# Patient Record
Sex: Female | Born: 1975 | Race: White | Hispanic: No | Marital: Married | Smoking: Never smoker
Health system: Southern US, Community
[De-identification: ages and names within clinical notes are randomized; demographics above are authoritative.]

## PROBLEM LIST (undated history)

## (undated) DIAGNOSIS — J45909 Unspecified asthma, uncomplicated: Secondary | ICD-10-CM

## (undated) DIAGNOSIS — R011 Cardiac murmur, unspecified: Secondary | ICD-10-CM

---
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
## 2000-03-01 ENCOUNTER — Ambulatory Visit (HOSPITAL_COMMUNITY): Admission: RE | Admit: 2000-03-01 | Discharge: 2000-03-01 | Payer: Self-pay | Admitting: Internal Medicine

## 2000-04-19 ENCOUNTER — Encounter: Payer: Self-pay | Admitting: Family Medicine

## 2000-04-19 ENCOUNTER — Encounter: Admission: RE | Admit: 2000-04-19 | Discharge: 2000-04-19 | Payer: Self-pay | Admitting: Family Medicine

## 2006-03-27 ENCOUNTER — Inpatient Hospital Stay (HOSPITAL_COMMUNITY): Admission: AD | Admit: 2006-03-27 | Discharge: 2006-03-31 | Payer: Self-pay | Admitting: Obstetrics and Gynecology

## 2008-06-02 ENCOUNTER — Encounter: Admission: RE | Admit: 2008-06-02 | Discharge: 2008-06-02 | Payer: Self-pay | Admitting: Sports Medicine

## 2008-06-12 ENCOUNTER — Ambulatory Visit (HOSPITAL_BASED_OUTPATIENT_CLINIC_OR_DEPARTMENT_OTHER): Admission: RE | Admit: 2008-06-12 | Discharge: 2008-06-12 | Payer: Self-pay | Admitting: Orthopedic Surgery

## 2008-12-18 HISTORY — PX: HAND TENDON SURGERY: SHX663

## 2010-01-17 ENCOUNTER — Encounter: Admission: RE | Admit: 2010-01-17 | Discharge: 2010-01-17 | Payer: Self-pay | Admitting: Obstetrics and Gynecology

## 2011-05-02 NOTE — Op Note (Signed)
NAME:  Sheila Cruz, Sheila Cruz NO.:  [id number]   MEDICAL RECORD NO.:  [id number]          PATIENT TYPE:  AMB   LOCATION:  DSC                          FACILITY:  [facility]   PHYSICIAN:  Cindee Salt, M.D.       DATE OF BIRTH:  May 07, 1976   DATE OF PROCEDURE:  DATE OF DISCHARGE:                               OPERATIVE REPORT   PREOPERATIVE DIAGNOSIS:  Ruptured ulnar collateral ligament,  metacarpophalangeal joint, left thumb.   POSTOPERATIVE DIAGNOSIS:  Ruptured ulnar collateral ligament,  metacarpophalangeal joint, left thumb.   OPERATION:  Repair of ulnar collateral ligament, metacarpophalangeal  joint, left thumb   SURGEON:  Cindee Salt, M.D.   ASSISTANT:  None.   ANESTHESIA:  Axillary block.   DATE OF OPERATION:  June 12, 2008.   ANESTHESIOLOGIST:  Germaine Pomfret, M.D.   HISTORY:  The patient is a 35 year old female with a history of an  injury to the metacarpophalangeal joint, left thumb.  This has been  painful for her.  This has not responded well to therapy.  She is  admitted for reconstruction.  MRI reveals a rupture of the ulnar  collateral ligament.  She is aware of risks and complications including  infection, recurrence injury to arteries, nerves, tendons, incomplete  relief of symptoms, dystrophy.  In the preoperative area, the patient is  seen.  The extremity marked by both the patient and surgeon, questions  encouraged and answered.   PROCEDURE:  The patient is brought to the operating room where an  axillary block was carried out without difficulty under the direction of  Dr. Jean Rosenthal.  She was prepped using DuraPrep, supine position, left arm  free.  A time-out was taken.  The limb was exsanguinated with an Esmarch  bandage, tourniquet placed high and the arm was inflated to 250 mmHg.  A  curvilinear incision was made over the metacarpophalangeal joint, apex  ulnarly, carried down through subcutaneous tissue.  Bleeders were  electrocauterized.   The dorsal and ulnar and radial sensory branches  were identified.  An incision was then made into the abductor  aponeurosis.  The collateral ligament was found be scarred, the joint  opened.  The cartilage was found to be intact.  The ligament was then  dissected free from the proximal phalanx where it had been avulsed, the  scar removed.  The area was then roughened with a rongeur.  Drill holes  were placed with a 0.035 K-wire bringing them out onto the radial side  of the proximal phalanx.  Two, 21-gauge needles were then inserted to  each one of the drill holes.  A 32-gauge monofilament wire was bent  over, these were passed through each of the needles and brought out to  the ulnar side.  The needles were removed.  A 2-0 Tycron suture was then  used as the modified Kessler suture into the out ligament.  These were  then inserted onto each one of the looped wires, then pulled through to  the radial side.  These were then tied over the bone on the radial  aspect, taking care to protect the radial sensory nerves.  This firmly  fixed the collateral ligament back into a roughened bed on the proximal  phalanx.  This was placed through a full range of motion.  No  instability was noted to stressing.  The joint was then irrigated.  The  remainder was closed with figure-of-eight, 4-0 Mersilene sutures.  The  abductor aponeurosis was repaired with a running Mersilene suture.  The  skin was repaired with a subcuticular 5-0 Vicryl Rapide suture.  A  sterile compressive dressing and splint thumb spica in nature was  applied.  On deflation  of the tourniquet, all fingers immediately pinked along with her thumb.  She was taken to the recovery room for observation in satisfactory  condition.  She will be discharged home to return to the [facility]  [place] in 1 week, on Vicodin.           ______________________________  Cindee Salt, M.D.     GK/MEDQ  D:  06/12/2008  T:  06/13/2008  Job:   [id number]

## 2011-05-05 NOTE — Discharge Summary (Signed)
NAME:  Sheila Cruz, Sheila Cruz NO.:  [id number]   MEDICAL RECORD NO.:  [id number]          PATIENT TYPE:  INP   LOCATION:  9118                          FACILITY:  [facility]   PHYSICIAN:  Lenoard Aden, M.D.DATE OF BIRTH:  [birth year]   DATE OF ADMISSION:  03/27/2006  DATE OF DISCHARGE:  03/31/2006                                 DISCHARGE SUMMARY   ADMISSION DIAGNOSES:  1. 39 weeks' gestation.  2. Pregnancy induced hypertension preeclampsia.  3. Favorable Bishop score.   DISCHARGE DIAGNOSES:  1. Postoperative day #3, status post Cesarean section for viable female      newborn in stable condition.  2. Resolving pregnancy induced hypertension.   HISTORY OF PRESENT ILLNESS:  The patient is a 35 year old gravida 1, para 0-  0-0-0 presenting at 30 weeks and 2/7.  Date of last menstrual period is June 24, 2005 with an EDD of March 31, 2006.  The patient is sent from [facility] after physician evaluation by Dr. Annabell Howells for elevated blood pressure and  increased deep tendon reflexes.  The patient's vital signs on admission:  Temperature 98, pulse 105, respirations 16, blood pressure 137/80.  Cervical  exam is 1-2 cm dilation, 70% effaced, vertex presentation with a bulging bag  of water.  The patient reports intermittent contractions and cramps with no  leakage of fluid, no vaginal bleeding, and active fetal activity.  The  patient reports intermittent headache with changes in vision earlier in the  day, mild nausea, and decreased appetite.   CURRENT MEDICATIONS:  1. The patient is on Zyrtec 10 mg daily.  2. Prenatal vitamins one tablet daily.  3. Tums over the counter as needed for indigestion and heartburn.   ALLERGIES:  1. The patient has known allergies to SULFA medications.  2. BIAXIN.   HISTORY OF PREGNANCY:  The patient has received prenatal care at [facility] since approximately nine weeks' gestation with primary care Arwa Yero of  Dr. Annabell Howells.   GYNECOLOGIC HISTORY:  The patient's gynecological history includes normal  Pap smears, no history of sexually transmitted diseases.   PAST FAMILY PSYCHIATRIC HISTORY:  1. Known IBS.  2. Heart murmur.  3. Fetal echo during current pregnancy.  No antibiotic prophylaxis is      required.   PAST SURGICAL HISTORY:  1. Wisdom teeth extraction.  2. Ganglion cyst removal from her hand.   LABORATORY DATA:  The patient's blood type is O positive, antibody screen is  negative.  Baseline hemoglobin is 13.8 with a baseline platelet count of  275.  Rubella status is immune.  Hepatitis B surface antigen is negative.  Syphilis screening is nonreactive.  HIV is nonreactive.  Gonorrhea and  Chlamydia cultures are negative.  Beta Strep is negative.  The patient's  baseline blood pressure in the first trimester is 100/62.   HOSPITAL COURSE:  The patient is admitted with plan of induction of labor  with Pitocin and artificial rupture of membranes.  Hospital day #1:  The  patient is admitted to labor and delivery for induction of labor for  PIH  preeclampsia.  The patient is in latent early labor on April 11 at midnight  with artificial rupture of membranes with a clear fluid.  The patient is 2  cm dilated, 80%, vertex presentation at a -2 station.  The patient is on low  dose Pitocin protocol.  By 8 a.m. on the same day the patient had progressed  to 6 cm dilation in active labor with an epidural anesthesia in place for  pain management.  The patient continued to progress and began active second  stage afternoon of March 28, 2006.  Attempt for vacuum assisted delivery  unsuccessful with observation for fetal descent, at which time the decision  was made to proceed for primary cesarean section for failure to descend.  See operative note of Dr. Billy Coast.  The patient delivered a viable female on  March 28, 2006 at 1407.  Apgar scores were 9 and 9.  Newborn weight is 8  pounds 10 ounces.  The patient proceed to  postoperative recovery and  mother/baby postoperative care.  Postoperative day #1, vital signs were  stable.  The patient is afebrile.  Hemoglobin postoperative is 10.6, down  from a baseline of 12.8.  Hematocrit is 31, down from a baseline of 37.4.  Platelet count is stable at 227.  PIH labs are within normal limits.  SGOT  is 22, SGPT is 15.  LDH is 143, down from the baseline of 310 on admission.  Uric acid is stable at 5.1.  Postoperative day #2 the patient remained  stable with vital signs stable, afebrile.  Incision is well-approximated  with staples.  The patient is discharged on postoperative day #3 in stable  condition.  The patient is discharged home with her newborn.  The patient is  bottle feeding.   DISCHARGE INSTRUCTIONS:   DIET:  The patient is on regular diet at home.   ACTIVITY:  Activity ad lib with no heavy lifting.  The patient is to follow  instruction booklet from [facility] for specific instructions on  postpartum and postoperative care.   DISCHARGE MEDICATIONS:  1. Prenatal vitamin once daily.  2. Allegra daily for allergies.  3. Ibuprofen 800 mg every 8 hours as needed for discomfort.  4. Percocet 1-2 tablets every 4-6 hours as needed for pain.   FOLLOWUP:  The patient is to return to [facility] in two weeks for a  wound and incision check and again at six weeks for her routine postpartum  visit.     Marlinda Mike, C.N.M.      Lenoard Aden, M.D.  Electronically Signed   TB/MEDQ  D:  05/30/2006  T:  05/30/2006  Job:  [id number]

## 2011-05-05 NOTE — Op Note (Signed)
NAME:  Sheila Cruz, Sheila Cruz NO.:  [id number]   MEDICAL RECORD NO.:  [id number]          PATIENT TYPE:  INP   LOCATION:  9118                          FACILITY:  [facility]   PHYSICIAN:  Lenoard Aden, M.D.DATE OF BIRTH:  May 31, 1976   DATE OF PROCEDURE:  03/28/2006  DATE OF DISCHARGE:                                 OPERATIVE REPORT   PREOPERATIVE DIAGNOSES:  Thirty-nine-week intrauterine pregnancy, pregnancy-  induced hypertension, failure to descend.   POSTOPERATIVE DIAGNOSES:  Thirty-nine-week intrauterine pregnancy, pregnancy-  induced hypertension, failure to descend.   PROCEDURE:  Primary low segment transverse cesarean section.   SURGEON:  Lenoard Aden, M.D.   ASSISTANT:  Maxie Better, M.D.   ANESTHESIA:  Epidural by Malen Gauze.   ESTIMATED BLOOD LOSS:  7 cc.   COMPLICATIONS:  None.   DRAINS:  Foley.   COUNTS:  Correct.   DISPOSITION:  Patient to recovery in good condition.   BRIEF OPERATIVE NOTE:  Being apprised of the risks of anesthesia, infection,  bleeding, intraoperative injury, need for repair, delayed versus immediate  complications to include bowel and bladder injury, the patient was brought  to the operating room where she was administered epidural anesthetic without  complications, prepped and draped in the usual sterile fashion.  Foley  catheter previously placed.  Marcaine solution placed and a Pfannenstiel  skin incision made with the scalpel down to fascia which was nicked in the  midline and tented up using Mayo scissors.  Rectus muscles dissected sharply  in the midline.  Peritoneum entered sharply and bladder blade placed.  Distal peritoneum scored sharply in the left lower uterine segment,  histotomy incision made.  Atraumatic delivery of a full-term living female,  occiput transverse position.  Handed to pediatricians in attendance.  Apgars  8 and 9.  Cord blood collected.  Placenta delivered manually intact, 3-  vessel cord.   Uterus exteriorized.  Small fundal fibroid, less than 2 cm,  noted.  Normal tubes, normal ovaries.  Uterus was curetted using a lap pack  and closed in two layers using a 0 Monocryl suture.  Interrupted sutures  were placed for hemostasis at the right lateral portion of the incision.  Bladder flap inspected and found to be hemostatic.  Irrigation accomplished.  Fascia closed using 0 Monocryl tissue in running fashion.  Skin closed using  staples.  The patient tolerated the procedure well and was transferred to  recovery in good condition.      Lenoard Aden, M.D.  Electronically Signed     RJT/MEDQ  D:  03/28/2006  T:  03/29/2006  Job:  [id number]

## 2011-09-14 LAB — HCG, SERUM, QUALITATIVE: Preg, Serum: NEGATIVE

## 2011-09-14 LAB — POCT HEMOGLOBIN-HEMACUE: Hemoglobin: 13.5

## 2011-12-27 ENCOUNTER — Other Ambulatory Visit: Payer: Self-pay | Admitting: Family Medicine

## 2011-12-27 DIAGNOSIS — J329 Chronic sinusitis, unspecified: Secondary | ICD-10-CM

## 2011-12-29 ENCOUNTER — Ambulatory Visit
Admission: RE | Admit: 2011-12-29 | Discharge: 2011-12-29 | Disposition: A | Discharge status: Home / Self Care | Payer: BC Managed Care – PPO | Source: Ambulatory Visit | Attending: Family Medicine | Admitting: Family Medicine

## 2011-12-29 DIAGNOSIS — J329 Chronic sinusitis, unspecified: Secondary | ICD-10-CM

## 2012-01-10 ENCOUNTER — Ambulatory Visit
Admission: RE | Admit: 2012-01-10 | Discharge: 2012-01-10 | Disposition: A | Discharge status: Home / Self Care | Payer: BC Managed Care – PPO | Source: Ambulatory Visit | Attending: Allergy | Admitting: Allergy

## 2012-01-10 ENCOUNTER — Other Ambulatory Visit: Payer: Self-pay | Admitting: Allergy

## 2012-01-10 DIAGNOSIS — R05 Cough: Secondary | ICD-10-CM

## 2013-02-28 ENCOUNTER — Other Ambulatory Visit: Payer: Self-pay | Admitting: Gastroenterology

## 2013-02-28 ENCOUNTER — Ambulatory Visit
Admission: RE | Admit: 2013-02-28 | Discharge: 2013-02-28 | Disposition: A | Discharge status: Home / Self Care | Payer: BC Managed Care – PPO | Source: Ambulatory Visit | Attending: Gastroenterology | Admitting: Gastroenterology

## 2013-02-28 DIAGNOSIS — R1032 Left lower quadrant pain: Secondary | ICD-10-CM

## 2013-02-28 MED ORDER — IOHEXOL 300 MG/ML  SOLN
30.0000 mL | Freq: Once | INTRAMUSCULAR | Status: AC | PRN
  Administered 2013-02-28: 30 mL via ORAL

## 2013-02-28 MED ORDER — IOHEXOL 300 MG/ML  SOLN
100.0000 mL | Freq: Once | INTRAMUSCULAR | Status: AC | PRN
  Administered 2013-02-28: 100 mL via INTRAVENOUS

## 2013-03-26 ENCOUNTER — Other Ambulatory Visit: Payer: Self-pay | Admitting: Gastroenterology

## 2013-03-26 DIAGNOSIS — K5790 Diverticulosis of intestine, part unspecified, without perforation or abscess without bleeding: Secondary | ICD-10-CM

## 2013-04-25 ENCOUNTER — Ambulatory Visit
Admission: RE | Admit: 2013-04-25 | Discharge: 2013-04-25 | Disposition: A | Discharge status: Home / Self Care | Payer: BC Managed Care – PPO | Source: Ambulatory Visit | Attending: Gastroenterology | Admitting: Gastroenterology

## 2013-04-25 DIAGNOSIS — K5790 Diverticulosis of intestine, part unspecified, without perforation or abscess without bleeding: Secondary | ICD-10-CM

## 2018-05-13 ENCOUNTER — Encounter (HOSPITAL_COMMUNITY): Payer: Self-pay | Admitting: Emergency Medicine

## 2018-05-13 ENCOUNTER — Other Ambulatory Visit: Payer: Self-pay

## 2018-05-13 ENCOUNTER — Ambulatory Visit (HOSPITAL_COMMUNITY)
Admission: EM | Admit: 2018-05-13 | Discharge: 2018-05-13 | Disposition: A | Discharge status: Home / Self Care | Payer: BLUE CROSS/BLUE SHIELD | Attending: Family Medicine | Admitting: Family Medicine

## 2018-05-13 ENCOUNTER — Ambulatory Visit (INDEPENDENT_AMBULATORY_CARE_PROVIDER_SITE_OTHER): Payer: BLUE CROSS/BLUE SHIELD

## 2018-05-13 DIAGNOSIS — R0781 Pleurodynia: Secondary | ICD-10-CM

## 2018-05-13 DIAGNOSIS — W19XXXA Unspecified fall, initial encounter: Secondary | ICD-10-CM

## 2018-05-13 HISTORY — DX: Cardiac murmur, unspecified: R01.1

## 2018-05-13 HISTORY — DX: Unspecified asthma, uncomplicated: J45.909

## 2018-05-13 MED ORDER — KETOROLAC TROMETHAMINE 60 MG/2ML IM SOLN
INTRAMUSCULAR | Status: AC
  Filled 2018-05-13: qty 2

## 2018-05-13 MED ORDER — KETOROLAC TROMETHAMINE 60 MG/2ML IM SOLN
60.0000 mg | Freq: Once | INTRAMUSCULAR | Status: AC
  Administered 2018-05-13: 60 mg via INTRAMUSCULAR

## 2018-05-13 MED ORDER — HYDROCODONE-ACETAMINOPHEN 5-325 MG PO TABS: 1.0000 | ORAL_TABLET | ORAL | 0 refills | Status: DC | PRN

## 2018-05-13 MED ORDER — IBUPROFEN 800 MG PO TABS: 800.0000 mg | ORAL_TABLET | Freq: Three times a day (TID) | ORAL | 0 refills | Status: AC | PRN

## 2018-05-13 NOTE — ED Triage Notes (Signed)
The patient presented to the [facility] with a complaint of back and right side rib pain secondary to a fall that occurred earlier this am.

## 2018-05-13 NOTE — ED Provider Notes (Signed)
[facility]    CSN: [id number] Arrival date & time: 05/13/18  1654     History   Chief Complaint Chief Complaint  Patient presents with  . Fall    HPI Sheila Cruz is a 42 y.o. female.   HPI  Patient was on a camping trip for this weekend.  She fell on her camper at about 1 in the morning and went backwards hitting her right ribs on a hard object.  Unknown.  She states "I was half asleep".  She landed sitting.  Ever since then she has had severe pain in her right lower ribs.  Hurts to breathe, cough, sneeze, laugh.  No prior injury.  No underlying lung disease such as asthma, COPD.  She has not taken anything for pain, or used ice or heat.  Did not know what to use. Previously healthy, on no medications except for allergy medicine. No cough, sputum, fever or history of pneumonia.  Past Medical History:  Diagnosis Date  . Asthma   . Heart murmur     There are no active problems to display for this patient.   Past Surgical History:  Procedure Laterality Date  . CESAREAN SECTION  2007  . FINGER SURGERY Right 1995  . HAND TENDON SURGERY Left 2010    OB History   None      Home Medications    Prior to Admission medications   Medication Sig Start Date End Date Taking? Authorizing Provider  fluticasone (FLONASE) 50 MCG/ACT nasal spray Place 1 spray into both nostrils daily.   Yes [provider]  levocetirizine (XYZAL) 5 MG tablet Take 5 mg by mouth every evening.   Yes [provider]  montelukast (SINGULAIR) 10 MG tablet Take 10 mg by mouth at bedtime.   Yes [provider]  HYDROcodone-acetaminophen (NORCO/VICODIN) 5-325 MG tablet Take 1-2 tablets by mouth every 4 (four) hours as needed. 05/13/18   Eustace Moore, MD  ibuprofen (ADVIL,MOTRIN) 800 MG tablet Take 1 tablet (800 mg total) by mouth every 8 (eight) hours as needed for moderate pain. 05/13/18   Eustace Moore, MD    Family History History reviewed. No  pertinent family history.  Social History Social History   Tobacco Use  . Smoking status: Never Smoker  . Smokeless tobacco: Never Used  Substance Use Topics  . Alcohol use: Yes    Alcohol/week: 0.6 oz    Types: 1 Glasses of wine per week  . Drug use: Never     Allergies   Biaxin [clarithromycin] and Sulfa antibiotics   Review of Systems Review of Systems  Constitutional: Negative for chills and fever.  HENT: Negative for ear pain and sore throat.   Eyes: Negative for pain and visual disturbance.  Respiratory: Negative for cough and shortness of breath.   Cardiovascular: Positive for chest pain. Negative for palpitations.       Chest wall pain  Gastrointestinal: Negative for abdominal pain and vomiting.  Genitourinary: Negative for dysuria and hematuria.  Musculoskeletal: Negative for arthralgias and back pain.  Skin: Negative for color change and rash.  Neurological: Negative for seizures and syncope.  All other systems reviewed and are negative.    Physical Exam Triage Vital Signs ED Triage Vitals  Enc Vitals Group     BP 05/13/18 1719 125/87     Pulse Rate 05/13/18 1719 81     Resp 05/13/18 1719 16     Temp 05/13/18 1719 98.9 F (37.2 C)  Temp Source 05/13/18 1719 Oral     SpO2 05/13/18 1719 100 %     Weight --      Height --      Head Circumference --      Peak Flow --      Pain Score 05/13/18 1722 10     Pain Loc --      Pain Edu? --      Excl. in GC? --    No data found.  Updated Vital Signs BP 125/87 (BP Location: Left Arm)   Pulse 81   Temp 98.9 F (37.2 C) (Oral)   Resp 16   SpO2 100%   Visual Acuity Right Eye Distance:   Left Eye Distance:   Bilateral Distance:    Right Eye Near:   Left Eye Near:    Bilateral Near:     Physical Exam  Constitutional: She appears well-developed and well-nourished. No distress.  HENT:  Head: Normocephalic and atraumatic.  Mouth/Throat: Oropharynx is clear and moist.  Eyes: Pupils are equal,  round, and reactive to light. Conjunctivae are normal.  Neck: Normal range of motion.  Cardiovascular: Normal rate, regular rhythm and normal heart sounds.  Pulmonary/Chest: Effort normal and breath sounds normal. No respiratory distress. She has no wheezes.     She exhibits tenderness.  Abdominal: Soft. She exhibits no distension.  Musculoskeletal: Normal range of motion. She exhibits no edema.  Lymphadenopathy:    She has no cervical adenopathy.  Neurological: She is alert.  Skin: Skin is warm and dry.     UC Treatments / Results  Labs (all labs ordered are listed, but only abnormal results are displayed) Labs Reviewed - No data to display  EKG None  Radiology Dg Ribs Unilateral W/chest Right  Result Date: 05/13/2018 CLINICAL DATA:  Right lower rib pain after falling and hitting her right side against a counter last night. EXAM: RIGHT RIBS AND CHEST - 3+ VIEW COMPARISON:  01/10/2012. FINDINGS: Normal sized heart. Clear lungs. Minimally prominent interstitial markings. No rib fracture or pneumothorax. IMPRESSION: No rib fracture.  Minimal chronic interstitial lung disease. Electronically Signed   By: Beckie Salts M.D.   On: 05/13/2018 17:59    Procedures Procedures (including critical care time)  Medications Ordered in UC Medications  ketorolac (TORADOL) injection 60 mg (60 mg Intramuscular Given 05/13/18 1810)    Initial Impression / Assessment and Plan / UC Course  I have reviewed the triage vital signs and the nursing notes.  Pertinent labs & imaging results that were available during my care of the patient were reviewed by me and considered in my medical decision making (see chart for details).      Final Clinical Impressions(s) / UC Diagnoses   Final diagnoses:  Rib pain  Fall, initial encounter     Discharge Instructions     Ice to area Ibuprofen for moderate pain Take with food Hydrocodone for severe pain Take deep breaths every couple of  hours Return promptly for increased pain, shortness of breath, fever or cough    ED Prescriptions    Medication Sig Dispense Auth. Provider   ibuprofen (ADVIL,MOTRIN) 800 MG tablet Take 1 tablet (800 mg total) by mouth every 8 (eight) hours as needed for moderate pain. 90 tablet Eustace Moore, MD   HYDROcodone-acetaminophen (NORCO/VICODIN) 5-325 MG tablet Take 1-2 tablets by mouth every 4 (four) hours as needed. 20 tablet Eustace Moore, MD     Controlled Substance Prescriptions [place] Controlled Substance Registry  consulted? Yes, I have consulted the [place] Controlled Substances Registry for this patient, and feel the risk/benefit ratio today is favorable for proceeding with this prescription for a controlled substance.   Eustace Moore, MD 05/13/18 [phone]

## 2018-05-13 NOTE — Discharge Instructions (Signed)
Ice to area Ibuprofen for moderate pain Take with food Hydrocodone for severe pain Take deep breaths every couple of hours Return promptly for increased pain, shortness of breath, fever or cough

## 2019-06-05 IMAGING — DX DG RIBS W/ CHEST 3+V*R*
3 series · 3 of 3 positions shown · non-contrast
Comparison: 01/10/2012.

CLINICAL DATA: Right lower rib pain after falling and hitting her
right side against a counter last night.

EXAM:
RIGHT RIBS AND CHEST - 3+ VIEW

[chest pa]
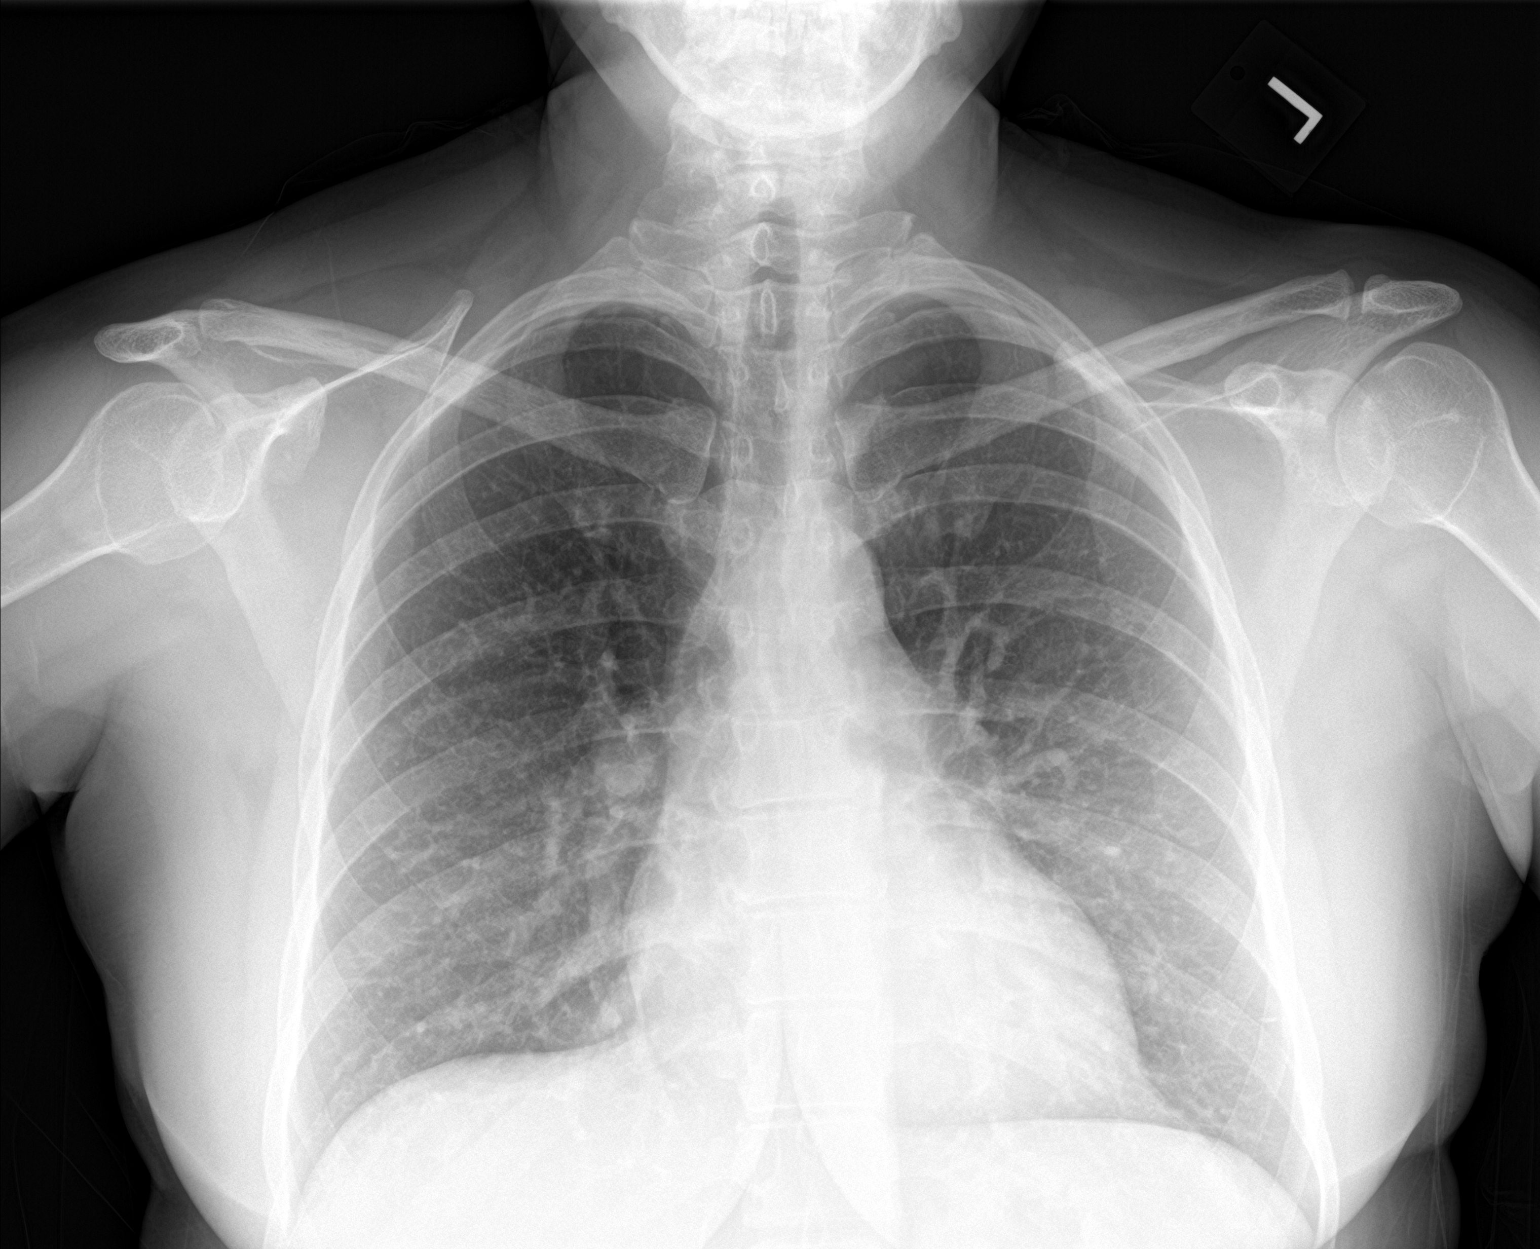

[rib pa]
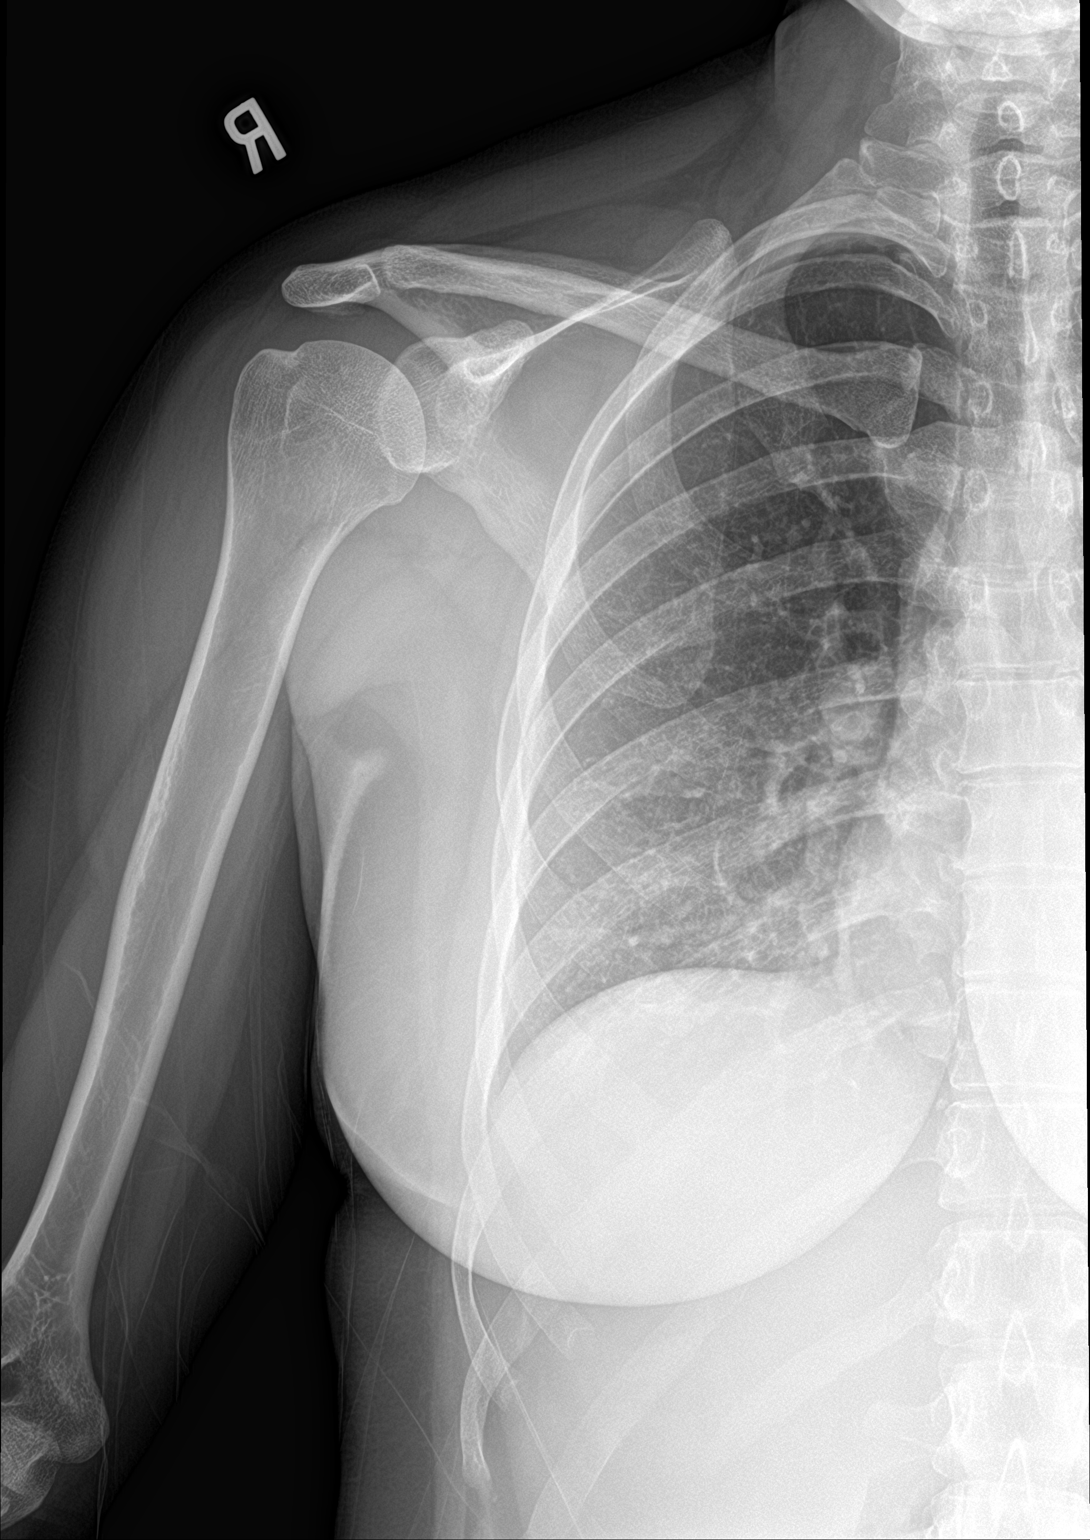

[rib obl]
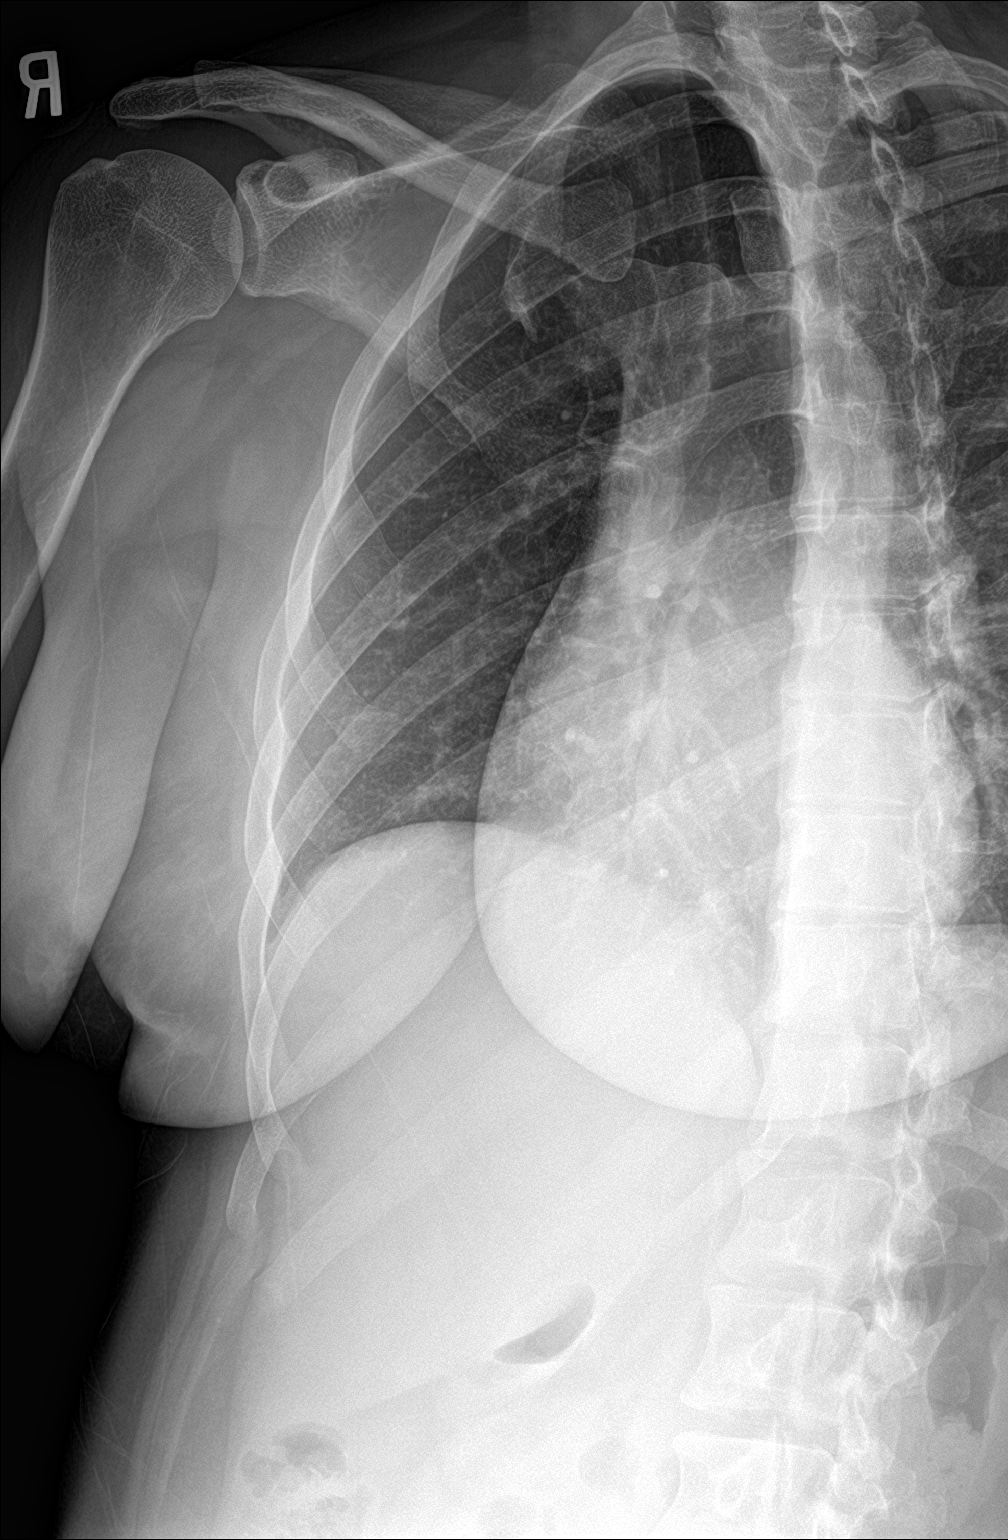

[3 of 3 positions shown; findings below may reference images not displayed]

FINDINGS: Normal sized heart. Clear lungs. Minimally prominent interstitial
markings. No rib fracture or pneumothorax.
IMPRESSION: No rib fracture.  Minimal chronic interstitial lung disease.

## 2020-03-04 ENCOUNTER — Ambulatory Visit
Admission: EM | Admit: 2020-03-04 | Discharge: 2020-03-04 | Disposition: A | Discharge status: Home / Self Care | Payer: BC Managed Care – PPO | Attending: Emergency Medicine | Admitting: Emergency Medicine

## 2020-03-04 ENCOUNTER — Other Ambulatory Visit: Payer: Self-pay

## 2020-03-04 DIAGNOSIS — H538 Other visual disturbances: Secondary | ICD-10-CM

## 2020-03-04 LAB — POCT FASTING CBG KUC MANUAL ENTRY: POCT Glucose (KUC): 114 mg/dL — AB (ref 70–99)

## 2020-03-04 NOTE — ED Provider Notes (Signed)
[name]    CSN: [id number] Arrival date & time: 03/04/20  1327      History   Chief Complaint Chief Complaint  Patient presents with  . Blurred Vision    HPI Sheila Cruz is a 44 y.o. female.   Patient presents with bilateral "hazy" vision since Tuesday evening.  She states that her vision is like she is looking through a plastic bag.  She also reports halos around lights and a sensation of pressure behind both of her eyes.  She denies injury.  She denies weakness, numbness,  fever, chills, acute eye pain, eye drainage, or other symptoms.  She attempted treatment at home with OTC saline eyedrops which provided moderate temporary relief of her symptoms.     The history is provided by the patient.    Past Medical History:  Diagnosis Date  . Asthma   . Heart murmur     There are no problems to display for this patient.   Past Surgical History:  Procedure Laterality Date  . CESAREAN SECTION  2007  . FINGER SURGERY Right 1995  . HAND TENDON SURGERY Left 2010    OB History   No obstetric history on file.      Home Medications    Prior to Admission medications   Medication Sig Start Date End Date Taking? Authorizing Provider  fluticasone (FLONASE) 50 MCG/ACT nasal spray Place 1 spray into both nostrils daily.    [provider]  HYDROcodone-acetaminophen (NORCO/VICODIN) 5-325 MG tablet Take 1-2 tablets by mouth every 4 (four) hours as needed. 05/13/18   Eustace Moore, MD  ibuprofen (ADVIL,MOTRIN) 800 MG tablet Take 1 tablet (800 mg total) by mouth every 8 (eight) hours as needed for moderate pain. 05/13/18   Eustace Moore, MD  levocetirizine (XYZAL) 5 MG tablet Take 5 mg by mouth every evening.    [provider]  montelukast (SINGULAIR) 10 MG tablet Take 10 mg by mouth at bedtime.    [provider]    Family History Family History  Problem Relation Age of Onset  . Fibromyalgia Mother   . Diabetes Father   .  Cancer Father   . Hypertension Father   . Heart Problems Father   . Hyperlipidemia Father   . Obesity Father     Social History Social History   Tobacco Use  . Smoking status: Never Smoker  . Smokeless tobacco: Never Used  Substance Use Topics  . Alcohol use: Yes    Alcohol/week: 1.0 standard drinks    Types: 1 Glasses of wine per week  . Drug use: Never     Allergies   Biaxin [clarithromycin] and Sulfa antibiotics   Review of Systems Review of Systems  Constitutional: Negative for chills and fever.  HENT: Negative for ear pain and sore throat.   Eyes: Positive for visual disturbance. Negative for pain and discharge.  Respiratory: Negative for cough and shortness of breath.   Cardiovascular: Negative for chest pain and palpitations.  Gastrointestinal: Negative for abdominal pain and vomiting.  Genitourinary: Negative for dysuria and hematuria.  Musculoskeletal: Negative for arthralgias and back pain.  Skin: Negative for color change and rash.  Neurological: Negative for dizziness, seizures, syncope, weakness, numbness and headaches.  All other systems reviewed and are negative.    Physical Exam Triage Vital Signs ED Triage Vitals  Enc Vitals Group     BP      Pulse      Resp  Temp      Temp src      SpO2      Weight      Height      Head Circumference      Peak Flow      Pain Score      Pain Loc      Pain Edu?      Excl. in GC?    No data found.  Updated Vital Signs BP 130/86 (BP Location: Left Arm)   Pulse 88   Temp 99.5 F (37.5 C) (Oral)   Resp 16   Wt 136 lb (61.7 kg)   SpO2 97%   Visual Acuity Right Eye Distance:   Left Eye Distance:   Bilateral Distance:    Right Eye Near:   Left Eye Near:    Bilateral Near:     Physical Exam Vitals and nursing note reviewed.  Constitutional:      General: She is not in acute distress.    Appearance: She is well-developed. She is not ill-appearing.  HENT:     Head: Normocephalic and  atraumatic.     Right Ear: Tympanic membrane normal.     Left Ear: Tympanic membrane normal.     Mouth/Throat:     Mouth: Mucous membranes are moist.     Pharynx: Oropharynx is clear.  Eyes:     General: Lids are normal. Vision grossly intact.     Extraocular Movements: Extraocular movements intact.     Conjunctiva/sclera: Conjunctivae normal.     Pupils: Pupils are equal, round, and reactive to light.  Cardiovascular:     Rate and Rhythm: Normal rate and regular rhythm.     Heart sounds: No murmur.  Pulmonary:     Effort: Pulmonary effort is normal. No respiratory distress.     Breath sounds: Normal breath sounds.  Abdominal:     Palpations: Abdomen is soft.     Tenderness: There is no abdominal tenderness. There is no guarding or rebound.  Musculoskeletal:     Cervical back: Neck supple.  Skin:    General: Skin is warm and dry.     Findings: No rash.  Neurological:     General: No focal deficit present.     Mental Status: She is alert and oriented to person, place, and time.     Sensory: No sensory deficit.     Motor: No weakness.     Gait: Gait normal.  Psychiatric:        Mood and Affect: Mood normal.        Behavior: Behavior normal.      UC Treatments / Results  Labs (all labs ordered are listed, but only abnormal results are displayed) Labs Reviewed  POCT FASTING CBG [facility] MANUAL ENTRY - Abnormal; Notable for the following components:      Result Value   POCT Glucose ([facility]) 114 (*)    All other components within normal limits    EKG   Radiology No results found.  Procedures Procedures (including critical care time)  Medications Ordered in UC Medications - No data to display  Initial Impression / Assessment and Plan / UC Course  I have reviewed the triage vital signs and the nursing notes.  Pertinent labs & imaging results that were available during my care of the patient were reviewed by me and considered in my medical decision making (see chart for  details).    Blurred vision.  Sending patient to [facility] for  evaluation; they are able to see her this afternoon.  Patient agrees to plan of care.     Final Clinical Impressions(s) / UC Diagnoses   Final diagnoses:  Blurred vision, bilateral     Discharge Instructions     Go to [facility] for your appointment this afternoon at 3 PM.    [address] [place]      ED Prescriptions    None     I have reviewed the PDMP during this encounter.   Sharion Balloon, NP 03/04/20 1414

## 2020-03-04 NOTE — ED Triage Notes (Signed)
Pt is here with blurred vision in both eyes, states when she looks at things it seems like shes looking through a plastic bag, halos around lights this started Tuesday night.

## 2020-03-04 NOTE — Discharge Instructions (Signed)
Go to [facility] for your appointment this afternoon at 3 PM.    [address] [place] [phone]

## 2020-03-20 ENCOUNTER — Ambulatory Visit: Payer: BC Managed Care – PPO | Attending: Internal Medicine

## 2020-03-20 DIAGNOSIS — Z23 Encounter for immunization: Secondary | ICD-10-CM

## 2020-03-20 NOTE — Progress Notes (Signed)
   Covid-19 Vaccination Clinic  Name:  Sheila Cruz    MRN: [id number] DOB: 07-18-1976  03/20/2020  [name] was observed post Covid-19 immunization for 30 minutes based on pre-vaccination screening without incident. She was provided with Vaccine Information Sheet and instruction to access the V-Safe system.   [name] was instructed to call 911 with any severe reactions post vaccine: Marland Kitchen Difficulty breathing  . Swelling of face and throat  . A fast heartbeat  . A bad rash all over body  . Dizziness and weakness   Immunizations Administered    Name Date Dose VIS Date Route   Pfizer COVID-19 Vaccine 03/20/2020  9:04 AM 0.3 mL 11/28/2019 Intramuscular   Manufacturer: [employer], [employer]   Lot: [id number]   NDC: [address]-1

## 2020-04-13 ENCOUNTER — Ambulatory Visit: Payer: BC Managed Care – PPO | Attending: Internal Medicine

## 2020-04-13 DIAGNOSIS — Z23 Encounter for immunization: Secondary | ICD-10-CM

## 2020-04-13 NOTE — Progress Notes (Signed)
   Covid-19 Vaccination Clinic  Name:  Alany Borman    MRN: [id number] DOB: 1976-12-13  04/13/2020  Ms. Ciaravino was observed post Covid-19 immunization for 15 minutes without incident. She was provided with Vaccine Information Sheet and instruction to access the V-Safe system.   Ms. Valido was instructed to call 911 with any severe reactions post vaccine: Marland Kitchen Difficulty breathing  . Swelling of face and throat  . A fast heartbeat  . A bad rash all over body  . Dizziness and weakness   Immunizations Administered    Name Date Dose VIS Date Route   Pfizer COVID-19 Vaccine 04/13/2020  9:16 AM 0.3 mL 02/11/2019 Intramuscular   Manufacturer: [employer], [employer]   Lot: [id number]   NDC: [address]-9

## 2020-09-10 ENCOUNTER — Other Ambulatory Visit: Payer: Self-pay | Admitting: Obstetrics and Gynecology

## 2020-09-10 DIAGNOSIS — R928 Other abnormal and inconclusive findings on diagnostic imaging of breast: Secondary | ICD-10-CM

## 2020-09-27 ENCOUNTER — Ambulatory Visit
Admission: RE | Admit: 2020-09-27 | Discharge: 2020-09-27 | Disposition: A | Discharge status: Home / Self Care | Payer: BC Managed Care – PPO | Source: Ambulatory Visit | Attending: Obstetrics and Gynecology | Admitting: Obstetrics and Gynecology

## 2020-09-27 ENCOUNTER — Other Ambulatory Visit: Payer: Self-pay

## 2020-09-27 DIAGNOSIS — R928 Other abnormal and inconclusive findings on diagnostic imaging of breast: Secondary | ICD-10-CM

## 2020-11-03 ENCOUNTER — Encounter (HOSPITAL_COMMUNITY): Payer: Self-pay

## 2020-11-03 ENCOUNTER — Other Ambulatory Visit: Payer: Self-pay

## 2020-11-03 ENCOUNTER — Ambulatory Visit (HOSPITAL_COMMUNITY)
Admission: EM | Admit: 2020-11-03 | Discharge: 2020-11-03 | Disposition: A | Discharge status: Home / Self Care | Payer: BC Managed Care – PPO

## 2020-11-03 DIAGNOSIS — S61211A Laceration without foreign body of left index finger without damage to nail, initial encounter: Secondary | ICD-10-CM | POA: Diagnosis not present

## 2020-11-03 NOTE — ED Triage Notes (Signed)
Pt c/o laceration to left index finger. States she cut her finger with paring knife last night at approx 2100. Small amount of bleeding from appro 1 cm laceration to finger pad with skin flap partially in place.   Last tetanus unknown.

## 2020-11-03 NOTE — Discharge Instructions (Addendum)
We placed glue on the wound Follow up as needed for continued or worsening symptoms

## 2020-11-04 NOTE — ED Provider Notes (Signed)
[name]    CSN: [id number] Arrival date & time: 11/03/20  1332      History   Chief Complaint Chief Complaint  Patient presents with  . Laceration    HPI Sheila Cruz is a 44 y.o. female.   Patient is a 44 year old female presents today with laceration to the tip of the left index finger.  She cut her finger with a paring knife last night approximately 2100.  Small amount of bleeding.  Has been cleaning the finger and placing Band-Aid and Kerlix wrap.  Tetanus unknown.     Past Medical History:  Diagnosis Date  . Asthma   . Heart murmur     There are no problems to display for this patient.   Past Surgical History:  Procedure Laterality Date  . CESAREAN SECTION  2007  . FINGER SURGERY Right 1995  . HAND TENDON SURGERY Left 2010    OB History   No obstetric history on file.      Home Medications    Prior to Admission medications   Medication Sig Start Date End Date Taking? Authorizing Provider  fluticasone (FLONASE) 50 MCG/ACT nasal spray Place 1 spray into both nostrils daily.   Yes [provider]  levocetirizine (XYZAL) 5 MG tablet Take 5 mg by mouth every evening.   Yes [provider]  HYDROcodone-acetaminophen (NORCO/VICODIN) 5-325 MG tablet Take 1-2 tablets by mouth every 4 (four) hours as needed. 05/13/18   Eustace Moore, MD  ibuprofen (ADVIL,MOTRIN) 800 MG tablet Take 1 tablet (800 mg total) by mouth every 8 (eight) hours as needed for moderate pain. 05/13/18   Eustace Moore, MD  levonorgestrel (MIRENA) 20 MCG/24HR IUD by Intrauterine route.    [provider]  montelukast (SINGULAIR) 10 MG tablet Take 10 mg by mouth at bedtime.    [provider]  pseudoephedrine (SUDAFED 12 HOUR) 120 MG 12 hr tablet Take by mouth.    [provider]    Family History Family History  Problem Relation Age of Onset  . Fibromyalgia Mother   . Diabetes Father   . Cancer Father   . Hypertension  Father   . Heart Problems Father   . Hyperlipidemia Father   . Obesity Father     Social History Social History   Tobacco Use  . Smoking status: Never Smoker  . Smokeless tobacco: Never Used  Vaping Use  . Vaping Use: Never used  Substance Use Topics  . Alcohol use: Yes    Alcohol/week: 1.0 standard drink    Types: 1 Glasses of wine per week  . Drug use: Never     Allergies   Biaxin [clarithromycin] and Sulfa antibiotics   Review of Systems Review of Systems   Physical Exam Triage Vital Signs ED Triage Vitals  Enc Vitals Group     BP 11/03/20 1351 133/67     Pulse Rate 11/03/20 1351 95     Resp 11/03/20 1351 19     Temp 11/03/20 1351 98.5 F (36.9 C)     Temp Source 11/03/20 1351 Oral     SpO2 11/03/20 1351 99 %     Weight --      Height --      Head Circumference --      Peak Flow --      Pain Score 11/03/20 1347 1     Pain Loc --      Pain Edu? --      Excl.  in GC? --    No data found.  Updated Vital Signs BP 133/67 (BP Location: Right Arm)   Pulse 95   Temp 98.5 F (36.9 C) (Oral)   Resp 19   SpO2 99%   Visual Acuity Right Eye Distance:   Left Eye Distance:   Bilateral Distance:    Right Eye Near:   Left Eye Near:    Bilateral Near:     Physical Exam Vitals and nursing note reviewed.  Constitutional:      General: She is not in acute distress.    Appearance: Normal appearance. She is not ill-appearing, toxic-appearing or diaphoretic.  HENT:     Head: Normocephalic.     Nose: Nose normal.  Eyes:     Conjunctiva/sclera: Conjunctivae normal.  Pulmonary:     Effort: Pulmonary effort is normal.  Musculoskeletal:        General: Normal range of motion.     Cervical back: Normal range of motion.  Skin:    General: Skin is warm and dry.     Findings: No rash.     Comments: Small superficial laceration to tip of left index finger.  Small flap.  Bleeding controlled Already closing  Neurological:     Mental Status: She is alert.   Psychiatric:        Mood and Affect: Mood normal.      UC Treatments / Results  Labs (all labs ordered are listed, but only abnormal results are displayed) Labs Reviewed - No data to display  EKG   Radiology No results found.  Procedures Laceration Repair  Date/Time: 11/04/2020 8:20 AM Performed by: Janace Aris, NP Authorized by: Janace Aris, NP   Consent:    Consent obtained:  Verbal   Consent given by:  Patient   Risks discussed:  Infection, need for additional repair, pain, poor cosmetic result and poor wound healing   Alternatives discussed:  No treatment and delayed treatment Universal protocol:    Patient identity confirmed:  Verbally with patient Anesthesia (see MAR for exact dosages):    Anesthesia method:  Local infiltration Laceration details:    Location:  Finger   Finger location:  L index finger Repair type:    Repair type:  Simple Exploration:    Hemostasis achieved with:  Direct pressure Treatment:    Area cleansed with:  Shur-Clens Skin repair:    Repair method:  Tissue adhesive Approximation:    Approximation:  Close Post-procedure details:    Dressing:  Open (no dressing)   Patient tolerance of procedure:  Tolerated well, no immediate complications   (including critical care time)  Medications Ordered in UC Medications - No data to display  Initial Impression / Assessment and Plan / UC Course  I have reviewed the triage vital signs and the nursing notes.  Pertinent labs & imaging results that were available during my care of the patient were reviewed by me and considered in my medical decision making (see chart for details).     Laceration to tip of left index finger.  Closed with Dermabond. Instructions given Follow up as needed for continued or worsening symptoms  Final Clinical Impressions(s) / UC Diagnoses   Final diagnoses:  Laceration of left index finger without foreign body without damage to nail, initial encounter      Discharge Instructions     We placed glue on the wound Follow up as needed for continued or worsening symptoms     ED Prescriptions  None     PDMP not reviewed this encounter.   Janace Aris, NP 11/04/20 [phone]

## 2021-07-19 ENCOUNTER — Ambulatory Visit
Admission: EM | Admit: 2021-07-19 | Discharge: 2021-07-19 | Disposition: A | Discharge status: Home / Self Care | Payer: BC Managed Care – PPO | Attending: Physician Assistant | Admitting: Physician Assistant

## 2021-07-19 ENCOUNTER — Other Ambulatory Visit: Payer: Self-pay

## 2021-07-19 DIAGNOSIS — U071 COVID-19: Secondary | ICD-10-CM

## 2021-07-19 MED ORDER — PAXLOVID 20 X 150 MG & 10 X 100MG PO TBPK: 3.0000 | ORAL_TABLET | Freq: Two times a day (BID) | ORAL | 0 refills | Status: AC

## 2021-07-19 MED ORDER — AZELASTINE HCL 0.1 % NA SOLN: 2.0000 | Freq: Two times a day (BID) | NASAL | 0 refills | Status: AC

## 2021-07-19 NOTE — ED Provider Notes (Signed)
[name]    CSN: [id number] Arrival date & time: 07/19/21  1438      History   Chief Complaint Chief Complaint  Patient presents with   Sore Throat    HPI Sheila Cruz is a 45 y.o. female.   45 year old female comes in for 2-day history of URI symptoms.  Has had postnasal drainage, sore throat, minimal cough.  Denies fever, shortness of breath, wheezing.  Home COVID test positive.  Interested in [employer]   Past Medical History:  Diagnosis Date   Asthma    Heart murmur     There are no problems to display for this patient.   Past Surgical History:  Procedure Laterality Date   CESAREAN SECTION  2007   FINGER SURGERY Right 1995   HAND TENDON SURGERY Left 2010    OB History   No obstetric history on file.      Home Medications    Prior to Admission medications   Medication Sig Start Date End Date Taking? Authorizing Provider  azelastine (ASTELIN) 0.1 % nasal spray Place 2 sprays into both nostrils 2 (two) times daily. 07/19/21  Yes Yanel Dombrosky V, PA-C  Nirmatrelvir & Ritonavir (PAXLOVID) 20 x 150 MG & 10 x 100MG  TBPK Take 3 tablets by mouth in the morning and at bedtime. Follow pack direction 07/19/21  Yes Rahel Carlton V, PA-C  fluticasone (FLONASE) 50 MCG/ACT nasal spray Place 1 spray into both nostrils daily.    [provider]  ibuprofen (ADVIL,MOTRIN) 800 MG tablet Take 1 tablet (800 mg total) by mouth every 8 (eight) hours as needed for moderate pain. 05/13/18   05/15/18, MD  levocetirizine (XYZAL) 5 MG tablet Take 5 mg by mouth every evening.    [provider]  levonorgestrel (MIRENA) 20 MCG/24HR IUD by Intrauterine route.    [provider]  montelukast (SINGULAIR) 10 MG tablet Take 10 mg by mouth at bedtime.    [provider]  pseudoephedrine (SUDAFED 12 HOUR) 120 MG 12 hr tablet Take by mouth.    [provider]    Family History Family History  Problem Relation Age of Onset   Fibromyalgia Mother     Diabetes Father    Cancer Father    Hypertension Father    Heart Problems Father    Hyperlipidemia Father    Obesity Father     Social History Social History   Tobacco Use   Smoking status: Never   Smokeless tobacco: Never  Vaping Use   Vaping Use: Never used  Substance Use Topics   Alcohol use: Yes    Alcohol/week: 1.0 standard drink    Types: 1 Glasses of wine per week   Drug use: Never     Allergies   Biaxin [clarithromycin] and Sulfa antibiotics   Review of Systems Review of Systems  Reason unable to perform ROS: See HPI as above.    Physical Exam Triage Vital Signs ED Triage Vitals  Enc Vitals Group     BP 07/19/21 1556 131/89     Pulse Rate 07/19/21 1556 80     Resp 07/19/21 1556 16     Temp 07/19/21 1556 98.5 F (36.9 C)     Temp Source 07/19/21 1556 Oral     SpO2 07/19/21 1556 98 %     Weight --      Height --      Head Circumference --      Peak Flow --  Pain Score 07/19/21 1552 0     Pain Loc --      Pain Edu? --      Excl. in GC? --    No data found.  Updated Vital Signs BP 131/89 (BP Location: Left Arm)   Pulse 80   Temp 98.5 F (36.9 C) (Oral)   Resp 16   SpO2 98%   Visual Acuity Right Eye Distance:   Left Eye Distance:   Bilateral Distance:    Right Eye Near:   Left Eye Near:    Bilateral Near:     Physical Exam Constitutional:      General: She is not in acute distress.    Appearance: Normal appearance. She is well-developed. She is not ill-appearing, toxic-appearing or diaphoretic.  HENT:     Head: Normocephalic and atraumatic.     Right Ear: Tympanic membrane, ear canal and external ear normal. Tympanic membrane is not erythematous or bulging.     Left Ear: Tympanic membrane, ear canal and external ear normal. Tympanic membrane is not erythematous or bulging.     Nose:     Right Sinus: No maxillary sinus tenderness or frontal sinus tenderness.     Left Sinus: No maxillary sinus tenderness or frontal sinus  tenderness.     Mouth/Throat:     Mouth: Mucous membranes are moist.     Pharynx: Oropharynx is clear. Uvula midline.  Eyes:     Conjunctiva/sclera: Conjunctivae normal.     Pupils: Pupils are equal, round, and reactive to light.  Cardiovascular:     Rate and Rhythm: Normal rate and regular rhythm.  Pulmonary:     Effort: Pulmonary effort is normal. No accessory muscle usage, prolonged expiration, respiratory distress or retractions.     Breath sounds: No decreased air movement or transmitted upper airway sounds. No decreased breath sounds.     Comments: LCTAB Musculoskeletal:     Cervical back: Normal range of motion and neck supple.  Skin:    General: Skin is warm and dry.  Neurological:     Mental Status: She is alert and oriented to person, place, and time.     UC Treatments / Results  Labs (all labs ordered are listed, but only abnormal results are displayed) Labs Reviewed - No data to display  EKG   Radiology No results found.  Procedures Procedures (including critical care time)  Medications Ordered in UC Medications - No data to display  Initial Impression / Assessment and Plan / UC Course  I have reviewed the triage vital signs and the nursing notes.  Pertinent labs & imaging results that were available during my care of the patient were reviewed by me and considered in my medical decision making (see chart for details).    Patient afebrile, nontoxic.  LCTAB, satting at 98% on RA. Start Paxlovid as directed.  Hold Flonase while on Paxlovid.  Other symptomatic treatment discussed.  Return precautions given.  Final Clinical Impressions(s) / UC Diagnoses   Final diagnoses:  COVID-19     ED Prescriptions     Medication Sig Dispense Auth. Provider   Nirmatrelvir & Ritonavir (PAXLOVID) 20 x 150 MG & 10 x 100MG  TBPK Take 3 tablets by mouth in the morning and at bedtime. Follow pack direction 30 each Graceyn Fodor V, PA-C   azelastine (ASTELIN) 0.1 % nasal spray  Place 2 sprays into both nostrils 2 (two) times daily. 30 mL , PA-C      PDMP not  reviewed this encounter.   Belinda Fisher, PA-C 07/19/21 1730

## 2021-07-19 NOTE — ED Triage Notes (Signed)
Patient presents to Urgent Care with complaints of sore throat today. Pt states she has also had post nasal drip yesterday. She had a positive at home covid test today. She states her husband tested positive on Sunday. Treating symptoms sudafed and allergy medications.   Denies fever.

## 2021-07-19 NOTE — Discharge Instructions (Addendum)
Start Paxlovid as directed.  You can hold Flonase while you on paxlovid. Start azelastine if needed. Keep hydrated, urine should be clear to pale yellow in color. Monitor for any shortness of breath, chest pain.

## 2022-12-06 ENCOUNTER — Ambulatory Visit
Admission: EM | Admit: 2022-12-06 | Discharge: 2022-12-06 | Disposition: A | Discharge status: Home / Self Care | Payer: BC Managed Care – PPO | Attending: Emergency Medicine | Admitting: Emergency Medicine

## 2022-12-06 DIAGNOSIS — J0100 Acute maxillary sinusitis, unspecified: Secondary | ICD-10-CM | POA: Diagnosis not present

## 2022-12-06 MED ORDER — AMOXICILLIN 875 MG PO TABS: 875.0000 mg | ORAL_TABLET | Freq: Two times a day (BID) | ORAL | 0 refills | Status: AC

## 2022-12-06 MED ORDER — PREDNISONE 10 MG (21) PO TBPK: ORAL_TABLET | Freq: Every day | ORAL | 0 refills | Status: DC

## 2022-12-06 NOTE — ED Triage Notes (Signed)
Patient to Urgent Care with complaints of nasal congestion and sinus pain. Reports yellowish/ blood tinged nasal drainage x4 weeks.   Denies any known fevers.   Has been taking xyzal/ flonase/ astepro.

## 2022-12-06 NOTE — Discharge Instructions (Addendum)
Take the amoxicillin and prednisone as directed.  Follow up with your primary care provider if your symptoms are not improving.    

## 2022-12-06 NOTE — ED Provider Notes (Signed)
[name]    CSN: [id number] Arrival date & time: 12/06/22  4196      History   Chief Complaint Chief Complaint  Patient presents with   Nasal Congestion    HPI Sheila Cruz is a 46 y.o. female.  Patient presents with 3-week history of sinus congestion, sinus pressure, nasal drip, runny nose.  Treatment at home with Xyzal, Flonase, Astepro, Sudafed.  She denies fever, ear pain, sore throat, cough, wheezing, shortness of breath, or other symptoms.  Her medical history includes asthma, allergies, heart murmur.   The history is provided by the patient and medical records.    Past Medical History:  Diagnosis Date   Asthma    Heart murmur     There are no problems to display for this patient.   Past Surgical History:  Procedure Laterality Date   CESAREAN SECTION  2007   FINGER SURGERY Right 1995   HAND TENDON SURGERY Left 2010    OB History   No obstetric history on file.      Home Medications    Prior to Admission medications   Medication Sig Start Date End Date Taking? Authorizing Provider  amoxicillin (AMOXIL) 875 MG tablet Take 1 tablet (875 mg total) by mouth 2 (two) times daily for 10 days. 12/06/22 12/16/22 Yes Mickie Bail, NP  predniSONE (STERAPRED UNI-PAK 21 TAB) 10 MG (21) TBPK tablet Take by mouth daily. As directed 12/06/22  Yes Mickie Bail, NP  azelastine (ASTELIN) 0.1 % nasal spray Place 2 sprays into both nostrils 2 (two) times daily. 07/19/21   Cathie Hoops, Amy V, PA-C  fluticasone (FLONASE) 50 MCG/ACT nasal spray Place 1 spray into both nostrils daily.    [provider]  ibuprofen (ADVIL,MOTRIN) 800 MG tablet Take 1 tablet (800 mg total) by mouth every 8 (eight) hours as needed for moderate pain. 05/13/18   Eustace Moore, MD  levocetirizine (XYZAL) 5 MG tablet Take 5 mg by mouth every evening.    [provider]  levonorgestrel (MIRENA) 20 MCG/24HR IUD by Intrauterine route.    [provider]  montelukast  (SINGULAIR) 10 MG tablet Take 10 mg by mouth at bedtime.    [provider]  Nirmatrelvir & Ritonavir (PAXLOVID) 20 x 150 MG & 10 x 100MG  TBPK Take 3 tablets by mouth in the morning and at bedtime. Follow pack direction 07/19/21   09/18/21, Amy V, PA-C  pseudoephedrine (SUDAFED 12 HOUR) 120 MG 12 hr tablet Take by mouth.    [provider]    Family History Family History  Problem Relation Age of Onset   Fibromyalgia Mother    Diabetes Father    Cancer Father    Hypertension Father    Heart Problems Father    Hyperlipidemia Father    Obesity Father     Social History Social History   Tobacco Use   Smoking status: Never   Smokeless tobacco: Never  Vaping Use   Vaping Use: Never used  Substance Use Topics   Alcohol use: Yes    Alcohol/week: 1.0 standard drink of alcohol    Types: 1 Glasses of wine per week   Drug use: Never     Allergies   Biaxin [clarithromycin] and Sulfa antibiotics   Review of Systems Review of Systems  Constitutional:  Negative for chills and fever.  HENT:  Positive for congestion, postnasal drip, rhinorrhea and sinus pressure. Negative for ear pain and sore throat.   Respiratory:  Negative  for cough, shortness of breath and wheezing.   Cardiovascular:  Negative for chest pain and palpitations.  Gastrointestinal:  Negative for diarrhea and vomiting.  Skin:  Negative for color change and rash.  All other systems reviewed and are negative.    Physical Exam Triage Vital Signs ED Triage Vitals  Enc Vitals Group     BP 12/06/22 0941 (!) 141/80     Pulse Rate 12/06/22 0937 84     Resp 12/06/22 0937 18     Temp 12/06/22 0937 98.3 F (36.8 C)     Temp src --      SpO2 12/06/22 0937 97 %     Weight --      Height 12/06/22 0939 5\' 2"  (1.575 m)     Head Circumference --      Peak Flow --      Pain Score 12/06/22 0937 0     Pain Loc --      Pain Edu? --      Excl. in GC? --    No data found.  Updated Vital Signs BP (!) 141/80    Pulse 84   Temp 98.3 F (36.8 C)   Resp 18   Ht 5\' 2"  (1.575 m)   SpO2 97%   BMI 24.87 kg/m   Visual Acuity Right Eye Distance:   Left Eye Distance:   Bilateral Distance:    Right Eye Near:   Left Eye Near:    Bilateral Near:     Physical Exam Vitals and nursing note reviewed.  Constitutional:      General: She is not in acute distress.    Appearance: Normal appearance. She is well-developed. She is not ill-appearing.  HENT:     Right Ear: Tympanic membrane normal.     Left Ear: Tympanic membrane normal.     Nose: Congestion present.     Mouth/Throat:     Mouth: Mucous membranes are moist.     Pharynx: Oropharynx is clear.  Cardiovascular:     Rate and Rhythm: Normal rate and regular rhythm.     Heart sounds: Normal heart sounds.  Pulmonary:     Effort: Pulmonary effort is normal. No respiratory distress.     Breath sounds: Normal breath sounds.  Musculoskeletal:     Cervical back: Neck supple.  Skin:    General: Skin is warm and dry.  Neurological:     Mental Status: She is alert.  Psychiatric:        Mood and Affect: Mood normal.        Behavior: Behavior normal.      UC Treatments / Results  Labs (all labs ordered are listed, but only abnormal results are displayed) Labs Reviewed - No data to display  EKG   Radiology No results found.  Procedures Procedures (including critical care time)  Medications Ordered in UC Medications - No data to display  Initial Impression / Assessment and Plan / UC Course  I have reviewed the triage vital signs and the nursing notes.  Pertinent labs & imaging results that were available during my care of the patient were reviewed by me and considered in my medical decision making (see chart for details).    Acute sinusitis.  VSS, no respiratory distress, O2 sat 97% on room air, lungs are clear.  Patient has been symptomatic for 4 weeks.  Treating with amoxicillin and prednisone.  Instructed patient to follow up  with her PCP if her symptoms are not improving.  She agrees to plan of care.    Final Clinical Impressions(s) / UC Diagnoses   Final diagnoses:  Acute non-recurrent maxillary sinusitis     Discharge Instructions      Take the amoxicillin and prednisone as directed.  Follow up with your primary care provider if your symptoms are not improving.        ED Prescriptions     Medication Sig Dispense Auth. Provider   amoxicillin (AMOXIL) 875 MG tablet Take 1 tablet (875 mg total) by mouth 2 (two) times daily for 10 days. 20 tablet Mickie Bail, NP   predniSONE (STERAPRED UNI-PAK 21 TAB) 10 MG (21) TBPK tablet Take by mouth daily. As directed 21 tablet Mickie Bail, NP      PDMP not reviewed this encounter.   Mickie Bail, NP 12/06/22 1012

## 2023-03-25 ENCOUNTER — Ambulatory Visit
Admission: EM | Admit: 2023-03-25 | Discharge: 2023-03-25 | Disposition: A | Discharge status: Home / Self Care | Payer: BC Managed Care – PPO | Attending: Emergency Medicine | Admitting: Emergency Medicine

## 2023-03-25 DIAGNOSIS — L03213 Periorbital cellulitis: Secondary | ICD-10-CM | POA: Diagnosis not present

## 2023-03-25 DIAGNOSIS — H00012 Hordeolum externum right lower eyelid: Secondary | ICD-10-CM

## 2023-03-25 DIAGNOSIS — J0100 Acute maxillary sinusitis, unspecified: Secondary | ICD-10-CM

## 2023-03-25 MED ORDER — AMOXICILLIN-POT CLAVULANATE 875-125 MG PO TABS: 1.0000 | ORAL_TABLET | Freq: Two times a day (BID) | ORAL | 0 refills | Status: AC

## 2023-03-25 NOTE — ED Triage Notes (Signed)
Patient presents to UC for nasal congestion x 2 weeks. Taking sudafed. Has sinus issues and takes nasal spray and allergy meds.

## 2023-03-25 NOTE — Discharge Instructions (Addendum)
Take the Augmentin as directed.  Establish a primary care provider as soon as possible.

## 2023-03-25 NOTE — ED Provider Notes (Signed)
[facility] Sheila Cruz    CSN: [id number] Arrival date & time: 03/25/23  1004      History   Chief Complaint Chief Complaint  Patient presents with   Nasal Congestion    HPI Sheila Cruz is a 47 y.o. female.  Patient presents with 2-week history of sinus pressure, congestion, postnasal drip, runny nose, mild occasional cough.  Treatment attempted with nasal spray and OTC allergy medication.  Her medical history includes asthma and seasonal allergies.  Patient also presents with tender redness of her right lower eyelid x 2 to 3 days.  No eye injury, eye pain, change in vision.  She denies fever, sore throat, ear pain, shortness of breath, or other symptoms.  Patient was seen here on 12/06/2022 for acute sinusitis; treated with amoxicillin and prednisone.  The history is provided by the patient and medical records.    Past Medical History:  Diagnosis Date   Asthma    Heart murmur     There are no problems to display for this patient.   Past Surgical History:  Procedure Laterality Date   CESAREAN SECTION  2007   FINGER SURGERY Right 1995   HAND TENDON SURGERY Left 2010    OB History   No obstetric history on file.      Home Medications    Prior to Admission medications   Medication Sig Start Date End Date Taking? Authorizing Provider  amoxicillin-clavulanate (AUGMENTIN) 875-125 MG tablet Take 1 tablet by mouth every 12 (twelve) hours for 10 days. 03/25/23 04/04/23 Yes Mickie Bail, NP  azelastine (ASTELIN) 0.1 % nasal spray Place 2 sprays into both nostrils 2 (two) times daily. 07/19/21   Cathie Hoops, Amy V, PA-C  fluticasone (FLONASE) 50 MCG/ACT nasal spray Place 1 spray into both nostrils daily.    [provider]  ibuprofen (ADVIL,MOTRIN) 800 MG tablet Take 1 tablet (800 mg total) by mouth every 8 (eight) hours as needed for moderate pain. 05/13/18   Eustace Moore, MD  levocetirizine (XYZAL) 5 MG tablet Take 5 mg by mouth every evening.    [provider]   levonorgestrel (MIRENA) 20 MCG/24HR IUD by Intrauterine route.    [provider]  montelukast (SINGULAIR) 10 MG tablet Take 10 mg by mouth at bedtime.    [provider]  Nirmatrelvir & Ritonavir (PAXLOVID) 20 x 150 MG & 10 x 100MG  TBPK Take 3 tablets by mouth in the morning and at bedtime. Follow pack direction 07/19/21   Cathie Hoops, Amy V, PA-C  predniSONE (STERAPRED UNI-PAK 21 TAB) 10 MG (21) TBPK tablet Take by mouth daily. As directed 12/06/22   Mickie Bail, NP  pseudoephedrine (SUDAFED 12 HOUR) 120 MG 12 hr tablet Take by mouth.    [provider]    Family History Family History  Problem Relation Age of Onset   Fibromyalgia Mother    Diabetes Father    Cancer Father    Hypertension Father    Heart Problems Father    Hyperlipidemia Father    Obesity Father     Social History Social History   Tobacco Use   Smoking status: Never   Smokeless tobacco: Never  Vaping Use   Vaping Use: Never used  Substance Use Topics   Alcohol use: Yes    Alcohol/week: 1.0 standard drink of alcohol    Types: 1 Glasses of wine per week   Drug use: Never     Allergies   Biaxin [clarithromycin] and Sulfa antibiotics  Review of Systems Review of Systems  Constitutional:  Negative for chills and fever.  HENT:  Positive for congestion, postnasal drip, rhinorrhea and sinus pressure. Negative for ear pain and sore throat.   Eyes:  Positive for redness and itching. Negative for pain, discharge and visual disturbance.  Respiratory:  Positive for cough. Negative for shortness of breath.   Cardiovascular:  Negative for chest pain and palpitations.  All other systems reviewed and are negative.    Physical Exam Triage Vital Signs ED Triage Vitals  Enc Vitals Group     BP 03/25/23 1057 (!) 149/85     Pulse Rate 03/25/23 1057 (!) 103     Resp 03/25/23 1057 18     Temp 03/25/23 1057 97.6 F (36.4 C)     Temp src --      SpO2 03/25/23 1057 97 %     Weight --       Height --      Head Circumference --      Peak Flow --      Pain Score 03/25/23 1059 0     Pain Loc --      Pain Edu? --      Excl. in GC? --    No data found.  Updated Vital Signs BP (!) 149/85 (BP Location: Left Arm)   Pulse (!) 103   Temp 97.6 F (36.4 C)   Resp 18   SpO2 97%   Visual Acuity Right Eye Distance:   Left Eye Distance:   Bilateral Distance:    Right Eye Near:   Left Eye Near:    Bilateral Near:     Physical Exam Vitals and nursing note reviewed.  Constitutional:      General: She is not in acute distress.    Appearance: Normal appearance. She is well-developed. She is not ill-appearing.  HENT:     Left Ear: Tympanic membrane is erythematous.     Nose: Congestion and rhinorrhea present.     Mouth/Throat:     Mouth: Mucous membranes are moist.     Pharynx: Posterior oropharyngeal erythema present.  Eyes:     General: Vision grossly intact.        Right eye: No discharge.        Left eye: No discharge.     Extraocular Movements: Extraocular movements intact.     Conjunctiva/sclera: Conjunctivae normal.     Pupils: Pupils are equal, round, and reactive to light.   Cardiovascular:     Rate and Rhythm: Normal rate and regular rhythm.     Heart sounds: Normal heart sounds.  Pulmonary:     Effort: Pulmonary effort is normal. No respiratory distress.     Breath sounds: Normal breath sounds.  Musculoskeletal:     Cervical back: Neck supple.  Skin:    General: Skin is warm and dry.  Neurological:     Mental Status: She is alert.  Psychiatric:        Mood and Affect: Mood normal.        Behavior: Behavior normal.      UC Treatments / Results  Labs (all labs ordered are listed, but only abnormal results are displayed) Labs Reviewed - No data to display  EKG   Radiology No results found.  Procedures Procedures (including critical care time)  Medications Ordered in UC Medications - No data to display  Initial Impression / Assessment  and Plan / UC Course  I have reviewed the triage vital  signs and the nursing notes.  Pertinent labs & imaging results that were available during my care of the patient were reviewed by me and considered in my medical decision making (see chart for details).   Acute sinusitis, right lower eyelid stye.  Treating with Augmentin.  Patient does not currently have a PCP but plans to go on the [facility] website and schedule an appointment to establish with [place]; she declines assistance with this.  Discussed return precautions and ED precautions.  Education provided on sinus infection and stye.  She agrees to plan of care.    Final Clinical Impressions(s) / UC Diagnoses   Final diagnoses:  Acute non-recurrent maxillary sinusitis  Hordeolum externum of right lower eyelid     Discharge Instructions      Take the Augmentin as directed.  Establish a primary care provider as soon as possible.       ED Prescriptions     Medication Sig Dispense Auth. Provider   amoxicillin-clavulanate (AUGMENTIN) 875-125 MG tablet Take 1 tablet by mouth every 12 (twelve) hours for 10 days. 20 tablet Mickie Bail, NP      PDMP not reviewed this encounter.   Mickie Bail, NP 03/25/23 1147

## 2023-08-08 ENCOUNTER — Other Ambulatory Visit: Payer: Self-pay | Admitting: Orthopedic Surgery

## 2023-08-08 DIAGNOSIS — S83419A Sprain of medial collateral ligament of unspecified knee, initial encounter: Secondary | ICD-10-CM

## 2023-08-28 ENCOUNTER — Other Ambulatory Visit: Payer: BC Managed Care – PPO

## 2023-09-04 ENCOUNTER — Ambulatory Visit
Admission: EM | Admit: 2023-09-04 | Discharge: 2023-09-04 | Disposition: A | Discharge status: Home / Self Care | Payer: BC Managed Care – PPO | Attending: Emergency Medicine | Admitting: Emergency Medicine

## 2023-09-04 DIAGNOSIS — Z3202 Encounter for pregnancy test, result negative: Secondary | ICD-10-CM | POA: Insufficient documentation

## 2023-09-04 DIAGNOSIS — N3001 Acute cystitis with hematuria: Secondary | ICD-10-CM | POA: Diagnosis present

## 2023-09-04 LAB — POCT URINALYSIS DIP (MANUAL ENTRY)
Bilirubin, UA: NEGATIVE
Glucose, UA: NEGATIVE mg/dL
Nitrite, UA: NEGATIVE
Protein Ur, POC: >=300 mg/dL — AB
Spec Grav, UA: >=1.03 — AB (ref 1.010–1.025)
Urobilinogen, UA: 0.2 U/dL
pH, UA: 5.5 (ref 5.0–8.0)

## 2023-09-04 LAB — POCT URINE PREGNANCY: Preg Test, Ur: NEGATIVE

## 2023-09-04 MED ORDER — CEPHALEXIN 500 MG PO CAPS: 500.0000 mg | ORAL_CAPSULE | Freq: Two times a day (BID) | ORAL | 0 refills | Status: AC

## 2023-09-04 NOTE — ED Triage Notes (Addendum)
Patient to Urgent Care with complaints of urinary frequency/ dysuria.  Symptoms started yesterday.   Reports last uti abx was macrobid.

## 2023-09-04 NOTE — Discharge Instructions (Addendum)
Take the antibiotic as directed.  The urine culture is pending.  We will call you if it shows the need to change or discontinue your antibiotic.    Follow up with your primary care provider.

## 2023-09-04 NOTE — ED Provider Notes (Signed)
Sheila Cruz    CSN: [id number] Arrival date & time: 09/04/23  1848      History   Chief Complaint Chief Complaint  Patient presents with   Dysuria    HPI Sheila Cruz is a 47 y.o. female.  Patient presents with 1 day history of dysuria and urinary frequency.  No fever, abdominal pain, flank pain, vaginal discharge, pelvic pain, or other symptoms.  No OTC medications taken today.  The history is provided by the patient and medical records.    Past Medical History:  Diagnosis Date   Asthma    Heart murmur     There are no problems to display for this patient.   Past Surgical History:  Procedure Laterality Date   CESAREAN SECTION  2007   FINGER SURGERY Right 1995   HAND TENDON SURGERY Left 2010    OB History   No obstetric history on file.      Home Medications    Prior to Admission medications   Medication Sig Start Date End Date Taking? Authorizing Provider  cephALEXin (KEFLEX) 500 MG capsule Take 1 capsule (500 mg total) by mouth 2 (two) times daily for 5 days. 09/04/23 09/09/23 Yes Mickie Bail, NP  azelastine (ASTELIN) 0.1 % nasal spray Place 2 sprays into both nostrils 2 (two) times daily. 07/19/21   Cathie Hoops, Amy V, PA-C  fluticasone (FLONASE) 50 MCG/ACT nasal spray Place 1 spray into both nostrils daily.    [provider]  ibuprofen (ADVIL,MOTRIN) 800 MG tablet Take 1 tablet (800 mg total) by mouth every 8 (eight) hours as needed for moderate pain. 05/13/18   Eustace Moore, MD  levocetirizine (XYZAL) 5 MG tablet Take 5 mg by mouth every evening.    [provider]  levonorgestrel (MIRENA) 20 MCG/24HR IUD by Intrauterine route.    [provider]  montelukast (SINGULAIR) 10 MG tablet Take 10 mg by mouth at bedtime.    [provider]  Nirmatrelvir & Ritonavir (PAXLOVID) 20 x 150 MG & 10 x 100MG  TBPK Take 3 tablets by mouth in the morning and at bedtime. Follow pack direction 07/19/21   Cathie Hoops, Amy V, PA-C  predniSONE  (STERAPRED UNI-PAK 21 TAB) 10 MG (21) TBPK tablet Take by mouth daily. As directed 12/06/22   Mickie Bail, NP  pseudoephedrine (SUDAFED 12 HOUR) 120 MG 12 hr tablet Take by mouth.    [provider]    Family History Family History  Problem Relation Age of Onset   Fibromyalgia Mother    Diabetes Father    Cancer Father    Hypertension Father    Heart Problems Father    Hyperlipidemia Father    Obesity Father     Social History Social History   Tobacco Use   Smoking status: Never   Smokeless tobacco: Never  Vaping Use   Vaping status: Never Used  Substance Use Topics   Alcohol use: Yes    Alcohol/week: 1.0 standard drink of alcohol    Types: 1 Glasses of wine per week   Drug use: Never     Allergies   Biaxin [clarithromycin] and Sulfa antibiotics   Review of Systems Review of Systems  Constitutional:  Negative for chills and fever.  Gastrointestinal:  Negative for abdominal pain, diarrhea, nausea and vomiting.  Genitourinary:  Positive for dysuria and frequency. Negative for flank pain, hematuria, pelvic pain and vaginal discharge.     Physical Exam Triage Vital Signs ED Triage Vitals  Encounter  Vitals Group     BP      Systolic BP Percentile      Diastolic BP Percentile      Pulse      Resp      Temp      Temp src      SpO2      Weight      Height      Head Circumference      Peak Flow      Pain Score      Pain Loc      Pain Education      Exclude from Growth Chart    No data found.  Updated Vital Signs BP 138/85   Pulse 86   Temp 97.7 F (36.5 C)   Resp 17   LMP 09/02/2023   SpO2 98%   Visual Acuity Right Eye Distance:   Left Eye Distance:   Bilateral Distance:    Right Eye Near:   Left Eye Near:    Bilateral Near:     Physical Exam Vitals and nursing note reviewed.  Constitutional:      General: She is not in acute distress.    Appearance: She is well-developed.  HENT:     Mouth/Throat:     Mouth: Mucous  membranes are moist.  Cardiovascular:     Rate and Rhythm: Normal rate and regular rhythm.     Heart sounds: Normal heart sounds.  Pulmonary:     Effort: Pulmonary effort is normal. No respiratory distress.     Breath sounds: Normal breath sounds.  Abdominal:     General: Bowel sounds are normal.     Palpations: Abdomen is soft.     Tenderness: There is no abdominal tenderness. There is no right CVA tenderness, left CVA tenderness, guarding or rebound.  Musculoskeletal:     Cervical back: Neck supple.  Skin:    General: Skin is warm and dry.  Neurological:     Mental Status: She is alert.  Psychiatric:        Mood and Affect: Mood normal.        Behavior: Behavior normal.      UC Treatments / Results  Labs (all labs ordered are listed, but only abnormal results are displayed) Labs Reviewed  POCT URINALYSIS DIP (MANUAL ENTRY) - Abnormal; Notable for the following components:      Result Value   Color, UA brown (*)    Clarity, UA cloudy (*)    Ketones, POC UA trace (5) (*)    Spec Grav, UA >=1.030 (*)    Blood, UA large (*)    Protein Ur, POC >=300 (*)    Leukocytes, UA Small (1+) (*)    All other components within normal limits  URINE CULTURE  POCT URINE PREGNANCY    EKG   Radiology No results found.  Procedures Procedures (including critical care time)  Medications Ordered in UC Medications - No data to display  Initial Impression / Assessment and Plan / UC Course  I have reviewed the triage vital signs and the nursing notes.  Pertinent labs & imaging results that were available during my care of the patient were reviewed by me and considered in my medical decision making (see chart for details).   Acute cystitis with hematuria, negative pregnancy test.  Treating with Keflex. Urine culture pending. Discussed with patient that we will call her if the urine culture shows the need to change or discontinue the antibiotic. Instructed  her to follow-up with her PCP  if her symptoms are not improving. Patient agrees to plan of care.     Final Clinical Impressions(s) / UC Diagnoses   Final diagnoses:  Acute cystitis with hematuria  Negative pregnancy test     Discharge Instructions      Take the antibiotic as directed.  The urine culture is pending.  We will call you if it shows the need to change or discontinue your antibiotic.    Follow up with your primary care provider.        ED Prescriptions     Medication Sig Dispense Auth. Provider   cephALEXin (KEFLEX) 500 MG capsule Take 1 capsule (500 mg total) by mouth 2 (two) times daily for 5 days. 10 capsule Mickie Bail, NP      PDMP not reviewed this encounter.   Mickie Bail, NP 09/04/23 1914

## 2023-09-05 ENCOUNTER — Other Ambulatory Visit: Payer: BC Managed Care – PPO

## 2023-09-06 LAB — URINE CULTURE: Culture: >=100000 — AB

## 2023-10-18 ENCOUNTER — Ambulatory Visit
Admission: EM | Admit: 2023-10-18 | Discharge: 2023-10-18 | Disposition: A | Discharge status: Home / Self Care | Payer: BC Managed Care – PPO | Attending: Emergency Medicine | Admitting: Emergency Medicine

## 2023-10-18 ENCOUNTER — Encounter: Payer: Self-pay | Admitting: Emergency Medicine

## 2023-10-18 DIAGNOSIS — J069 Acute upper respiratory infection, unspecified: Secondary | ICD-10-CM | POA: Diagnosis not present

## 2023-10-18 MED ORDER — DOXYCYCLINE HYCLATE 100 MG PO CAPS: 100.0000 mg | ORAL_CAPSULE | Freq: Two times a day (BID) | ORAL | 0 refills | Status: DC

## 2023-10-18 MED ORDER — BENZONATATE 100 MG PO CAPS: 100.0000 mg | ORAL_CAPSULE | Freq: Three times a day (TID) | ORAL | 0 refills | Status: DC

## 2023-10-18 MED ORDER — PREDNISONE 10 MG (21) PO TBPK: ORAL_TABLET | Freq: Every day | ORAL | 0 refills | Status: DC

## 2023-10-18 MED ORDER — ALBUTEROL SULFATE HFA 108 (90 BASE) MCG/ACT IN AERS: 2.0000 | INHALATION_SPRAY | Freq: Four times a day (QID) | RESPIRATORY_TRACT | 2 refills | Status: AC | PRN

## 2023-10-18 NOTE — ED Provider Notes (Signed)
Sheila Cruz    CSN: [id number] Arrival date & time: 10/18/23  8657      History   Chief Complaint Chief Complaint  Patient presents with   Cough   chest congestion    Headache    HPI Sheila Cruz is a 47 y.o. female.   Patient presents for evaluation of nasal congestion, nonproductive cough, shortness of breath experience with coughing, wheezing, intermittent nausea and intermittent diarrhea present for 5 days.  Sore throat beginning today, feels exacerbated by coughing.  Attributes diarrhea to recent antibiotic use, able to manage with probiotics and Imodium.  Associated intermittent headache.  Experiencing sinus pressure which has not worsened from baseline.  Known sick contact in household with similar symptoms.  Recently completed antibiotics 6 days ago for sinusitis, Augmentin prescribed.  Has attempted use of guaifenesin, Sudafed and Delsym which has provided minimal relief.  History of asthma.   Past Medical History:  Diagnosis Date   Asthma    Heart murmur     There are no problems to display for this patient.   Past Surgical History:  Procedure Laterality Date   CESAREAN SECTION  2007   FINGER SURGERY Right 1995   HAND TENDON SURGERY Left 2010    OB History   No obstetric history on file.      Home Medications    Prior to Admission medications   Medication Sig Start Date End Date Taking? Authorizing Provider  albuterol (VENTOLIN HFA) 108 (90 Base) MCG/ACT inhaler Inhale 2 puffs into the lungs every 6 (six) hours as needed for wheezing or shortness of breath. 10/18/23  Yes Prospero Mahnke R, NP  benzonatate (TESSALON) 100 MG capsule Take 1 capsule (100 mg total) by mouth every 8 (eight) hours. 10/18/23  Yes Brandi Armato, Elita Boone, NP  doxycycline (VIBRAMYCIN) 100 MG capsule Take 1 capsule (100 mg total) by mouth 2 (two) times daily. 10/18/23  Yes Octavia Mottola R, NP  predniSONE (STERAPRED UNI-PAK 21 TAB) 10 MG (21) TBPK tablet Take by mouth daily.  Take 6 tabs by mouth daily  for 1 days, then 5 tabs for 1 days, then 4 tabs for 1 days, then 3 tabs for 1 days, 2 tabs for 1 days, then 1 tab by mouth daily for 1 days 10/18/23  Yes Silver Achey R, NP  azelastine (ASTELIN) 0.1 % nasal spray Place 2 sprays into both nostrils 2 (two) times daily. 07/19/21   Cathie Hoops, Amy V, PA-C  fluticasone (FLONASE) 50 MCG/ACT nasal spray Place 1 spray into both nostrils daily.    [provider]  ibuprofen (ADVIL,MOTRIN) 800 MG tablet Take 1 tablet (800 mg total) by mouth every 8 (eight) hours as needed for moderate pain. 05/13/18   Eustace Moore, MD  levocetirizine (XYZAL) 5 MG tablet Take 5 mg by mouth every evening.    [provider]  levonorgestrel (MIRENA) 20 MCG/24HR IUD by Intrauterine route.    [provider]  montelukast (SINGULAIR) 10 MG tablet Take 10 mg by mouth at bedtime.    [provider]  Nirmatrelvir & Ritonavir (PAXLOVID) 20 x 150 MG & 10 x 100MG  TBPK Take 3 tablets by mouth in the morning and at bedtime. Follow pack direction 07/19/21   Cathie Hoops, Amy V, PA-C  pseudoephedrine (SUDAFED 12 HOUR) 120 MG 12 hr tablet Take by mouth.    [provider]    Family History Family History  Problem Relation Age of Onset   Fibromyalgia Mother    Diabetes  Father    Cancer Father    Hypertension Father    Heart Problems Father    Hyperlipidemia Father    Obesity Father     Social History Social History   Tobacco Use   Smoking status: Never   Smokeless tobacco: Never  Vaping Use   Vaping status: Never Used  Substance Use Topics   Alcohol use: Yes    Alcohol/week: 1.0 standard drink of alcohol    Types: 1 Glasses of wine per week   Drug use: Never     Allergies   Biaxin [clarithromycin] and Sulfa antibiotics   Review of Systems Review of Systems   Physical Exam Triage Vital Signs ED Triage Vitals  Encounter Vitals Group     BP 10/18/23 0922 126/80     Systolic BP Percentile --       Diastolic BP Percentile --      Pulse Rate 10/18/23 0922 93     Resp 10/18/23 0922 16     Temp 10/18/23 0922 99.2 F (37.3 C)     Temp Source 10/18/23 0922 Oral     SpO2 10/18/23 0922 95 %     Weight --      Height --      Head Circumference --      Peak Flow --      Pain Score 10/18/23 0926 0     Pain Loc --      Pain Education --      Exclude from Growth Chart --    No data found.  Updated Vital Signs BP 126/80 (BP Location: Left Arm)   Pulse 93   Temp 99.2 F (37.3 C) (Oral)   Resp 16   SpO2 95%   Visual Acuity Right Eye Distance:   Left Eye Distance:   Bilateral Distance:    Right Eye Near:   Left Eye Near:    Bilateral Near:     Physical Exam Constitutional:      Appearance: She is ill-appearing.  HENT:     Head: Normocephalic.     Right Ear: Tympanic membrane, ear canal and external ear normal.     Left Ear: Tympanic membrane, ear canal and external ear normal.     Nose: Congestion present. No rhinorrhea.     Mouth/Throat:     Pharynx: Posterior oropharyngeal erythema present. No oropharyngeal exudate.  Eyes:     Extraocular Movements: Extraocular movements intact.  Cardiovascular:     Rate and Rhythm: Normal rate and regular rhythm.     Pulses: Normal pulses.     Heart sounds: Normal heart sounds.  Pulmonary:     Effort: Pulmonary effort is normal.     Breath sounds: Normal breath sounds.  Musculoskeletal:        General: Normal range of motion.     Cervical back: Normal range of motion and neck supple.  Skin:    General: Skin is warm and dry.  Neurological:     Mental Status: She is alert and oriented to person, place, and time. Mental status is at baseline.      UC Treatments / Results  Labs (all labs ordered are listed, but only abnormal results are displayed) Labs Reviewed - No data to display  EKG   Radiology No results found.  Procedures Procedures (including critical care time)  Medications Ordered in UC Medications - No  data to display  Initial Impression / Assessment and Plan / UC Course  I have reviewed  the triage vital signs and the nursing notes.  Pertinent labs & imaging results that were available during my care of the patient were reviewed by me and considered in my medical decision making (see chart for details).  Acute URI  Patient is in no signs of distress nor toxic appearing.  Vital signs are stable.  Low suspicion for pneumonia, pneumothorax or bronchitis and therefore will defer imaging.  Sinusitis most likely fully resolved as symptoms flared after discontinuation of antibiotic, discussed this with the patient.  Prescribed doxycycline.  For management of shortness of breath and wheezing, prescribed prednisone taper and albuterol inhaler, history of asthma.  For coughing prescribed Tessalon.May use additional over-the-counter medications as needed for supportive care.  May follow-up with urgent care as needed if symptoms persist or worsen.    Final Clinical Impressions(s) / UC Diagnoses   Final diagnoses:  Acute URI     Discharge Instructions      Begin doxycycline every morning and every evening for 7 days for additional bacterial coverage, if this antibiotic does not resolve symptoms today and there is no bacteria present  Take prednisone as directed to open and relax airway to help settle shortness of breath and wheezing  May take 2 puffs of inhaler every 4-6 hours as needed for shortness of breath and wheezing when it is flared  May use Tessalon pill every 8 hours as needed to help calm cough    You can take Tylenol and/or Ibuprofen as needed for fever reduction and pain relief.   For cough: honey 1/2 to 1 teaspoon (you can dilute the honey in water or another fluid).  You can also use guaifenesin and dextromethorphan for cough. You can use a humidifier for chest congestion and cough.  If you don't have a humidifier, you can sit in the bathroom with the hot shower running.      For  sore throat: try warm salt water gargles, cepacol lozenges, throat spray, warm tea or water with lemon/honey, popsicles or ice, or OTC cold relief medicine for throat discomfort.   For congestion: take a daily anti-histamine like Zyrtec, Claritin, and a oral decongestant, such as pseudoephedrine.  You can also use Flonase 1-2 sprays in each nostril daily.   It is important to stay hydrated: drink plenty of fluids (water, gatorade/powerade/pedialyte, juices, or teas) to keep your throat moisturized and help further relieve irritation/discomfort.    ED Prescriptions     Medication Sig Dispense Auth. Provider   doxycycline (VIBRAMYCIN) 100 MG capsule Take 1 capsule (100 mg total) by mouth 2 (two) times daily. 14 capsule Samaiyah Howes R, NP   predniSONE (STERAPRED UNI-PAK 21 TAB) 10 MG (21) TBPK tablet Take by mouth daily. Take 6 tabs by mouth daily  for 1 days, then 5 tabs for 1 days, then 4 tabs for 1 days, then 3 tabs for 1 days, 2 tabs for 1 days, then 1 tab by mouth daily for 1 days 21 tablet Nivin Braniff R, NP   benzonatate (TESSALON) 100 MG capsule Take 1 capsule (100 mg total) by mouth every 8 (eight) hours. 21 capsule Findley Blankenbaker R, NP   albuterol (VENTOLIN HFA) 108 (90 Base) MCG/ACT inhaler Inhale 2 puffs into the lungs every 6 (six) hours as needed for wheezing or shortness of breath. 6.7 each Valinda Hoar, NP      PDMP not reviewed this encounter.   Valinda Hoar, NP 10/18/23 1012

## 2023-10-18 NOTE — Discharge Instructions (Addendum)
Begin doxycycline every morning and every evening for 7 days for additional bacterial coverage, if this antibiotic does not resolve symptoms today and there is no bacteria present  Take prednisone as directed to open and relax airway to help settle shortness of breath and wheezing  May take 2 puffs of inhaler every 4-6 hours as needed for shortness of breath and wheezing when it is flared  May use Tessalon pill every 8 hours as needed to help calm cough    You can take Tylenol and/or Ibuprofen as needed for fever reduction and pain relief.   For cough: honey 1/2 to 1 teaspoon (you can dilute the honey in water or another fluid).  You can also use guaifenesin and dextromethorphan for cough. You can use a humidifier for chest congestion and cough.  If you don't have a humidifier, you can sit in the bathroom with the hot shower running.      For sore throat: try warm salt water gargles, cepacol lozenges, throat spray, warm tea or water with lemon/honey, popsicles or ice, or OTC cold relief medicine for throat discomfort.   For congestion: take a daily anti-histamine like Zyrtec, Claritin, and a oral decongestant, such as pseudoephedrine.  You can also use Flonase 1-2 sprays in each nostril daily.   It is important to stay hydrated: drink plenty of fluids (water, gatorade/powerade/pedialyte, juices, or teas) to keep your throat moisturized and help further relieve irritation/discomfort.

## 2023-10-18 NOTE — ED Triage Notes (Addendum)
Pt presents with a cough, chest congestion, headache, and wheezing x 4 days. She was prescribed Augmentin on 10/16 for a sinus infection.

## 2024-02-01 ENCOUNTER — Ambulatory Visit
Admission: EM | Admit: 2024-02-01 | Discharge: 2024-02-01 | Disposition: A | Discharge status: Home / Self Care | Payer: BC Managed Care – PPO | Attending: Emergency Medicine | Admitting: Emergency Medicine

## 2024-02-01 DIAGNOSIS — R350 Frequency of micturition: Secondary | ICD-10-CM | POA: Insufficient documentation

## 2024-02-01 DIAGNOSIS — N3001 Acute cystitis with hematuria: Secondary | ICD-10-CM | POA: Diagnosis present

## 2024-02-01 LAB — POCT URINALYSIS DIP (MANUAL ENTRY)
Bilirubin, UA: NEGATIVE
Glucose, UA: NEGATIVE mg/dL
Nitrite, UA: NEGATIVE
Protein Ur, POC: 30 mg/dL — AB
Spec Grav, UA: 1.025 (ref 1.010–1.025)
Urobilinogen, UA: 0.2 U/dL
pH, UA: 6 (ref 5.0–8.0)

## 2024-02-01 MED ORDER — NITROFURANTOIN MONOHYD MACRO 100 MG PO CAPS: 100.0000 mg | ORAL_CAPSULE | Freq: Two times a day (BID) | ORAL | 0 refills | Status: AC

## 2024-02-01 NOTE — ED Triage Notes (Signed)
Pt c/o possible UTI x1day  Pt states that urinary burning has not started yet but she feels discomfort.   Pt has not been established with a PCP as of yet.  Pt states that she has reoccuring UTIs and believes she is having another one.  Pt states that she has used OTC oregano to treat her UTI  Pt is concerned about elevated ALT levels from recent antibiotic use for her UTI

## 2024-02-01 NOTE — ED Provider Notes (Signed)
[facility]    CSN: [id number] Arrival date & time: 02/01/24  1356      History   Chief Complaint Chief Complaint  Patient presents with   Urinary Tract Infection    HPI Sheila Cruz is a 48 y.o. female who presents with onset of urinary frequency since yesterday, but denies dysuria. Has hx of frequent UTI's and has used Oregano herbal infused in oil to help which had at times. She was on antibiotics for a UTI and respiratory infection last year and when she had labs after that her ALT showed slight elevation. Has not had fever or flank pain.     Past Medical History:  Diagnosis Date   Asthma    Heart murmur     There are no active problems to display for this patient.   Past Surgical History:  Procedure Laterality Date   CESAREAN SECTION  2007   FINGER SURGERY Right 1995   HAND TENDON SURGERY Left 2010    OB History   No obstetric history on file.      Home Medications    Prior to Admission medications   Medication Sig Start Date End Date Taking? Authorizing Provider  albuterol (VENTOLIN HFA) 108 (90 Base) MCG/ACT inhaler Inhale 2 puffs into the lungs every 6 (six) hours as needed for wheezing or shortness of breath. 10/18/23  Yes White, Adrienne R, NP  ibuprofen (ADVIL,MOTRIN) 800 MG tablet Take 1 tablet (800 mg total) by mouth every 8 (eight) hours as needed for moderate pain. 05/13/18  Yes Eustace Moore, MD  levocetirizine (XYZAL) 5 MG tablet Take 5 mg by mouth every evening.   Yes [provider]  levonorgestrel (MIRENA) 20 MCG/24HR IUD by Intrauterine route.   Yes [provider]  nitrofurantoin, macrocrystal-monohydrate, (MACROBID) 100 MG capsule Take 1 capsule (100 mg total) by mouth 2 (two) times daily. 02/01/24  Yes Rodriguez-Southworth, Nettie Elm, PA-C  azelastine (ASTELIN) 0.1 % nasal spray Place 2 sprays into both nostrils 2 (two) times daily. 07/19/21   Cathie Hoops, Amy V, PA-C  fluticasone (FLONASE) 50 MCG/ACT nasal spray Place 1  spray into both nostrils daily.    [provider]  montelukast (SINGULAIR) 10 MG tablet Take 10 mg by mouth at bedtime.    [provider]  Nirmatrelvir & Ritonavir (PAXLOVID) 20 x 150 MG & 10 x 100MG  TBPK Take 3 tablets by mouth in the morning and at bedtime. Follow pack direction 07/19/21   Cathie Hoops, Amy V, PA-C  pseudoephedrine (SUDAFED 12 HOUR) 120 MG 12 hr tablet Take by mouth.    [provider]    Family History Family History  Problem Relation Age of Onset   Fibromyalgia Mother    Diabetes Father    Cancer Father    Hypertension Father    Heart Problems Father    Hyperlipidemia Father    Obesity Father     Social History Social History   Tobacco Use   Smoking status: Never   Smokeless tobacco: Never  Vaping Use   Vaping status: Never Used  Substance Use Topics   Alcohol use: Yes    Alcohol/week: 1.0 standard drink of alcohol    Types: 1 Glasses of wine per week   Drug use: Never     Allergies   Biaxin [clarithromycin] and Sulfa antibiotics   Review of Systems Review of Systems As noted in HPI  Physical Exam Triage Vital Signs ED Triage Vitals  Encounter Vitals Group  BP 02/01/24 1442 (!) 144/84     Systolic BP Percentile --      Diastolic BP Percentile --      Pulse Rate 02/01/24 1442 94     Resp --      Temp 02/01/24 1442 99 F (37.2 C)     Temp Source 02/01/24 1442 Oral     SpO2 02/01/24 1442 98 %     Weight 02/01/24 1439 170 lb (77.1 kg)     Height 02/01/24 1439 5\' 2"  (1.575 m)     Head Circumference --      Peak Flow --      Pain Score 02/01/24 1437 5     Pain Loc --      Pain Education --      Exclude from Growth Chart --    No data found.  Updated Vital Signs BP (!) 144/84 (BP Location: Left Arm)   Pulse 94   Temp 99 F (37.2 C) (Oral)   Ht 5\' 2"  (1.575 m)   Wt 170 lb (77.1 kg)   LMP 01/01/2024   SpO2 98%   BMI 31.09 kg/m   Visual Acuity Right Eye Distance:   Left Eye Distance:   Bilateral Distance:     Right Eye Near:   Left Eye Near:    Bilateral Near:     Physical Exam Physical Exam Vitals and nursing note reviewed.  Constitutional:      General: She is not in acute distress.    Appearance: She is not toxic-appearing.  HENT:     Head: Normocephalic.     Right Ear: External ear normal.     Left Ear: External ear normal.  Eyes:     General: No scleral icterus.    Conjunctiva/sclera: Conjunctivae normal.  Pulmonary:     Effort: Pulmonary effort is normal.  Abdominal:     General: Bowel sounds are normal.     Palpations: Abdomen is soft. There is no mass.     Tenderness: There is no guarding or rebound.     Comments: - CVA tenderness   Musculoskeletal:        General: Normal range of motion.     Cervical back: Neck supple.    Skin:    General: Skin is warm and dry.     Findings: No rash.  Neurological:     Mental Status: She is alert and oriented to person, place, and time.     Gait: Gait normal.  Psychiatric:        Mood and Affect: Mood normal.        Behavior: Behavior normal.        Thought Content: Thought content normal.        Judgment: Judgment normal.    UC Treatments / Results  Labs (all labs ordered are listed, but only abnormal results are displayed) Labs Reviewed  POCT URINALYSIS DIP (MANUAL ENTRY) - Abnormal; Notable for the following components:      Result Value   Ketones, POC UA trace (5) (*)    Blood, UA small (*)    Protein Ur, POC =30 (*)    Leukocytes, UA Moderate (2+) (*)    All other components within normal limits  URINE CULTURE    EKG   Radiology No results found.  Procedures Procedures (including critical care time)  Medications Ordered in UC Medications - No data to display  Initial Impression / Assessment and Plan / UC Course  I have reviewed  the triage vital signs and the nursing notes.  Pertinent labs  results that were available during my care of the patient were reviewed by me and considered in my medical  decision making (see chart for details).  UTI  Urine culture ordered and we will inform her of the results if we need to change the medication I placed her on Macrobid as noted.    Final Clinical Impressions(s) / UC Diagnoses   Final diagnoses:  Frequency of urination  Acute cystitis with hematuria   Discharge Instructions   None    ED Prescriptions     Medication Sig Dispense Auth. Provider   nitrofurantoin, macrocrystal-monohydrate, (MACROBID) 100 MG capsule Take 1 capsule (100 mg total) by mouth 2 (two) times daily. 10 capsule Rodriguez-Southworth, Nettie Elm, PA-C      PDMP not reviewed this encounter.   Garey Ham, PA-C 02/01/24 1701

## 2024-02-03 LAB — URINE CULTURE
Culture: 60000 — AB
Special Requests: NORMAL

## 2024-09-12 ENCOUNTER — Ambulatory Visit
Admission: EM | Admit: 2024-09-12 | Discharge: 2024-09-12 | Disposition: A | Discharge status: Home / Self Care | Attending: Emergency Medicine | Admitting: Emergency Medicine

## 2024-09-12 ENCOUNTER — Ambulatory Visit (INDEPENDENT_AMBULATORY_CARE_PROVIDER_SITE_OTHER)

## 2024-09-12 ENCOUNTER — Telehealth: Payer: Self-pay | Admitting: Emergency Medicine

## 2024-09-12 ENCOUNTER — Encounter: Payer: Self-pay | Admitting: Emergency Medicine

## 2024-09-12 DIAGNOSIS — M79672 Pain in left foot: Secondary | ICD-10-CM | POA: Diagnosis not present

## 2024-09-12 NOTE — ED Provider Notes (Signed)
 Sheila Cruz    CSN: [id number] Arrival date & time: 09/12/24  1338      History   Chief Complaint Chief Complaint  Patient presents with   Foot Injury    HPI Sheila Cruz is a 48 y.o. female.   Patient presents for evaluation of pain and swelling to the left foot beginning today after injury, while at work a 20 to 30 pound tripod fell directly onto the midfoot into the big toe.  Now experiencing pain with movement and with bearing weight.  Denies numbness or tingling.  Has attempted ibuprofen , unsure of effectiveness.  Past Medical History:  Diagnosis Date   Asthma    Heart murmur     There are no active problems to display for this patient.   Past Surgical History:  Procedure Laterality Date   CESAREAN SECTION  2007   FINGER SURGERY Right 1995   HAND TENDON SURGERY Left 2010    OB History   No obstetric history on file.      Home Medications    Prior to Admission medications   Medication Sig Start Date End Date Taking? Authorizing Provider  albuterol  (VENTOLIN  HFA) 108 (90 Base) MCG/ACT inhaler Inhale 2 puffs into the lungs every 6 (six) hours as needed for wheezing or shortness of breath. 10/18/23   Otho Michalik, Shelba SAUNDERS, NP  azelastine  (ASTELIN ) 0.1 % nasal spray Place 2 sprays into both nostrils 2 (two) times daily. 07/19/21   Babara, Amy V, PA-C  fluticasone (FLONASE) 50 MCG/ACT nasal spray Place 1 spray into both nostrils daily.    [provider]  ibuprofen  (ADVIL ,MOTRIN ) 800 MG tablet Take 1 tablet (800 mg total) by mouth every 8 (eight) hours as needed for moderate pain. 05/13/18   Maranda Jamee Jacob, MD  levocetirizine (XYZAL) 5 MG tablet Take 5 mg by mouth every evening.    [provider]  levonorgestrel (MIRENA) 20 MCG/24HR IUD by Intrauterine route.    [provider]  montelukast (SINGULAIR) 10 MG tablet Take 10 mg by mouth at bedtime.    [provider]  Nirmatrelvir & Ritonavir  (PAXLOVID ) 20 x 150 MG & 10 x  100MG  TBPK Take 3 tablets by mouth in the morning and at bedtime. Follow pack direction 07/19/21   Babara, Amy V, PA-C  nitrofurantoin , macrocrystal-monohydrate, (MACROBID ) 100 MG capsule Take 1 capsule (100 mg total) by mouth 2 (two) times daily. 02/01/24   Rodriguez-Southworth, Sylvia, PA-C  pseudoephedrine (SUDAFED 12 HOUR) 120 MG 12 hr tablet Take by mouth.    [provider]    Family History Family History  Problem Relation Age of Onset   Fibromyalgia Mother    Diabetes Father    Cancer Father    Hypertension Father    Heart Problems Father    Hyperlipidemia Father    Obesity Father     Social History Social History   Tobacco Use   Smoking status: Never   Smokeless tobacco: Never  Vaping Use   Vaping status: Never Used  Substance Use Topics   Alcohol use: Yes    Alcohol/week: 1.0 standard drink of alcohol    Types: 1 Glasses of wine per week   Drug use: Never     Allergies   Biaxin [clarithromycin] and Sulfa antibiotics   Review of Systems Review of Systems   Physical Exam Triage Vital Signs ED Triage Vitals  Encounter Vitals Group     BP 09/12/24 1402 133/85     Girls Systolic BP  Percentile --      Girls Diastolic BP Percentile --      Boys Systolic BP Percentile --      Boys Diastolic BP Percentile --      Pulse Rate 09/12/24 1402 89     Resp 09/12/24 1402 18     Temp 09/12/24 1402 99.2 F (37.3 C)     Temp Source 09/12/24 1402 Oral     SpO2 09/12/24 1402 98 %     Weight --      Height --      Head Circumference --      Peak Flow --      Pain Score 09/12/24 1356 3     Pain Loc --      Pain Education --      Exclude from Growth Chart --    No data found.  Updated Vital Signs BP 133/85 (BP Location: Right Arm)   Pulse 89   Temp 99.2 F (37.3 C) (Oral)   Resp 18   SpO2 98%   Visual Acuity Right Eye Distance:   Left Eye Distance:   Bilateral Distance:    Right Eye Near:   Left Eye Near:    Bilateral Near:     Physical  Exam Constitutional:      Appearance: Normal appearance.  Eyes:     Extraocular Movements: Extraocular movements intact.  Pulmonary:     Effort: Pulmonary effort is normal.  Musculoskeletal:     Comments: Tenderness present to the base of the first metatarsal extending into the midfoot with mild swelling, no ecchymosis or deformity noted, able to bear weight and able to complete full range of motion but pain is elicited with movement, sensation intact, capillary refill less than 3, 2+ pedal pulse  Neurological:     Mental Status: She is alert and oriented to person, place, and time.      UC Treatments / Results  Labs (all labs ordered are listed, but only abnormal results are displayed) Labs Reviewed - No data to display  EKG   Radiology DG Foot Complete Left Result Date: 09/12/2024 CLINICAL DATA:  Blunt trauma to left foot with pain. EXAM: LEFT FOOT - COMPLETE 3+ VIEW COMPARISON:  None Available. FINDINGS: Bones, joint spaces and soft tissues are within normal without fracture or dislocation. Small inferior calcaneal spur. Soft tissues are unremarkable. IMPRESSION: No acute findings. Electronically Signed   By: Toribio Agreste M.D.   On: 09/12/2024 14:49    Procedures Procedures (including critical care time)  Medications Ordered in UC Medications - No data to display  Initial Impression / Assessment and Plan / UC Course  I have reviewed the triage vital signs and the nursing notes.  Pertinent labs & imaging results that were available during my care of the patient were reviewed by me and considered in my medical decision making (see chart for details).  Clinical Course as of 09/12/24 1522  Fri Sep 12, 2024  1452 DG Foot Complete Left [AW]    Clinical Course User Index [AW] Teresa Price R, NP    Acute left foot pain  X-rays negative, to report to patient via telephone, recommended NSAIDs and RICE for support with follow-up with orthopedics or podiatry if symptoms  continue to persist or worsen Final Clinical Impressions(s) / UC Diagnoses   Final diagnoses:  Acute foot pain, left     Discharge Instructions      X-ray is pending and you will be notified of results  via telephone  If there is a break in the toe, buddy tape the 1st and 2nd toe together whenever completing activity to add stability when complete movements  If there is a break in the foot then you will need to return to clinic to get a boot for stability  You may apply ice or heat over the affected area in 10 to 15-minute intervals  You may elevate whenever sitting and lying to help reduce swelling  Take ibuprofen  600 to 800 mg every 6-8 hours consistently for the next 24 to 48 hours to help reduce swelling and help reduce pain and you may use as needed, may take Tylenol  additionally as needed for severe pain  If there is a break in the bone then you will need to follow-up with either podiatry or orthopedics whose information was known from pain   ED Prescriptions   None    PDMP not reviewed this encounter.   Teresa Shelba SAUNDERS, NP 09/12/24 [phone]

## 2024-09-12 NOTE — Telephone Encounter (Signed)
 Reported x-ray results via telephone, 2 patient identifiers used, no change in treatment plan

## 2024-09-12 NOTE — ED Triage Notes (Signed)
 Patient reports that a 20-30 lbs tripod fell on left foot today. Patient now complains big toe pain and top of foot pain. Rates pain 3/10. Patient took ibuprofen  at 11:00 am and used ice packs.

## 2024-09-12 NOTE — Discharge Instructions (Addendum)
 X-ray is pending and you will be notified of results via telephone  If there is a break in the toe, buddy tape the 1st and 2nd toe together whenever completing activity to add stability when complete movements  If there is a break in the foot then you will need to return to clinic to get a boot for stability  You may apply ice or heat over the affected area in 10 to 15-minute intervals  You may elevate whenever sitting and lying to help reduce swelling  Take ibuprofen  600 to 800 mg every 6-8 hours consistently for the next 24 to 48 hours to help reduce swelling and help reduce pain and you may use as needed, may take Tylenol  additionally as needed for severe pain  If there is a break in the bone then you will need to follow-up with either podiatry or orthopedics whose information was known from pain
